# Patient Record
Sex: Female | Born: 1955 | Race: White | Hispanic: No | Smoking: Current every day smoker
Health system: Southern US, Community
[De-identification: ages and names within clinical notes are randomized; demographics above are authoritative.]

## PROBLEM LIST (undated history)

## (undated) DIAGNOSIS — I771 Stricture of artery: Secondary | ICD-10-CM

## (undated) DIAGNOSIS — I779 Disorder of arteries and arterioles, unspecified: Secondary | ICD-10-CM

## (undated) DIAGNOSIS — E559 Vitamin D deficiency, unspecified: Secondary | ICD-10-CM

## (undated) DIAGNOSIS — F419 Anxiety disorder, unspecified: Secondary | ICD-10-CM

## (undated) DIAGNOSIS — F319 Bipolar disorder, unspecified: Secondary | ICD-10-CM

## (undated) DIAGNOSIS — G4730 Sleep apnea, unspecified: Secondary | ICD-10-CM

## (undated) DIAGNOSIS — F32A Depression, unspecified: Secondary | ICD-10-CM

## (undated) DIAGNOSIS — E041 Nontoxic single thyroid nodule: Secondary | ICD-10-CM

## (undated) DIAGNOSIS — I369 Nonrheumatic tricuspid valve disorder, unspecified: Secondary | ICD-10-CM

## (undated) DIAGNOSIS — D499 Neoplasm of unspecified behavior of unspecified site: Secondary | ICD-10-CM

## (undated) DIAGNOSIS — I517 Cardiomegaly: Secondary | ICD-10-CM

## (undated) DIAGNOSIS — D804 Selective deficiency of immunoglobulin M [IgM]: Secondary | ICD-10-CM

## (undated) DIAGNOSIS — I2510 Atherosclerotic heart disease of native coronary artery without angina pectoris: Secondary | ICD-10-CM

## (undated) DIAGNOSIS — J449 Chronic obstructive pulmonary disease, unspecified: Secondary | ICD-10-CM

## (undated) DIAGNOSIS — E042 Nontoxic multinodular goiter: Secondary | ICD-10-CM

## (undated) DIAGNOSIS — I2585 Chronic coronary microvascular dysfunction: Secondary | ICD-10-CM

## (undated) DIAGNOSIS — E669 Obesity, unspecified: Secondary | ICD-10-CM

## (undated) DIAGNOSIS — E785 Hyperlipidemia, unspecified: Secondary | ICD-10-CM

## (undated) DIAGNOSIS — E119 Type 2 diabetes mellitus without complications: Secondary | ICD-10-CM

## (undated) DIAGNOSIS — N6019 Diffuse cystic mastopathy of unspecified breast: Secondary | ICD-10-CM

## (undated) DIAGNOSIS — T7840XA Allergy, unspecified, initial encounter: Secondary | ICD-10-CM

## (undated) DIAGNOSIS — E1142 Type 2 diabetes mellitus with diabetic polyneuropathy: Secondary | ICD-10-CM

## (undated) DIAGNOSIS — Z720 Tobacco use: Secondary | ICD-10-CM

## (undated) DIAGNOSIS — K219 Gastro-esophageal reflux disease without esophagitis: Secondary | ICD-10-CM

## (undated) DIAGNOSIS — I10 Essential (primary) hypertension: Secondary | ICD-10-CM

## (undated) HISTORY — DX: Essential (primary) hypertension: I10

## (undated) HISTORY — DX: Nontoxic multinodular goiter: E04.2

## (undated) HISTORY — DX: Diffuse cystic mastopathy of unspecified breast: N60.19

## (undated) HISTORY — DX: Vitamin D deficiency, unspecified: E55.9

## (undated) HISTORY — DX: Type 2 diabetes mellitus without complications: E11.9

## (undated) HISTORY — DX: Selective deficiency of immunoglobulin m (igm): D80.4

## (undated) HISTORY — DX: Nontoxic single thyroid nodule: E04.1

## (undated) HISTORY — PX: CHOLECYSTECTOMY: SHX55

## (undated) HISTORY — DX: Allergy, unspecified, initial encounter: T78.40XA

## (undated) HISTORY — DX: Hyperlipidemia, unspecified: E78.5

## (undated) HISTORY — DX: Bipolar disorder, unspecified: F31.9

## (undated) HISTORY — PX: BACK SURGERY: SHX140

## (undated) HISTORY — DX: Chronic obstructive pulmonary disease, unspecified: J44.9

## (undated) HISTORY — DX: Neoplasm of unspecified behavior of unspecified site: D49.9

## (undated) HISTORY — DX: Sleep apnea, unspecified: G47.30

## (undated) HISTORY — DX: Cardiomegaly: I51.7

## (undated) HISTORY — DX: Tobacco use: Z72.0

## (undated) HISTORY — PX: TONSILLECTOMY: SUR1361

## (undated) HISTORY — PX: TOTAL ABDOMINAL HYSTERECTOMY: SHX209

## (undated) HISTORY — DX: Depression, unspecified: F32.A

## (undated) HISTORY — DX: Anxiety disorder, unspecified: F41.9

## (undated) HISTORY — DX: Obesity, unspecified: E66.9

---

## 2014-08-26 DIAGNOSIS — E1140 Type 2 diabetes mellitus with diabetic neuropathy, unspecified: Secondary | ICD-10-CM | POA: Insufficient documentation

## 2015-09-08 DIAGNOSIS — E669 Obesity, unspecified: Secondary | ICD-10-CM | POA: Insufficient documentation

## 2015-10-19 DIAGNOSIS — E781 Pure hyperglyceridemia: Secondary | ICD-10-CM | POA: Insufficient documentation

## 2015-10-19 DIAGNOSIS — R12 Heartburn: Secondary | ICD-10-CM | POA: Insufficient documentation

## 2019-02-26 DIAGNOSIS — M5412 Radiculopathy, cervical region: Secondary | ICD-10-CM | POA: Insufficient documentation

## 2019-02-26 DIAGNOSIS — E119 Type 2 diabetes mellitus without complications: Secondary | ICD-10-CM | POA: Insufficient documentation

## 2020-07-14 NOTE — Progress Notes (Signed)
[facility]  [address] [place], [place] [address] Phone: [phone]  Fax: [phone]   Clinic Day:  07/15/2020  Referring physician: Remi Haggard, FNP  Chief Complaint: Rebecca Frost is a 64 y.o. female with a elevated hemoglobin who is referred in consultation by Threasa Alpha, FNP for assessment and management.   HPI: The patient was diagnosed with elevated hemoglobin.  CBC has been followed: 11/06/2019: Hematocrit 41.2, hemoglobin 14.1, MCV 88, platelets 252,000, WBC 10,600 (ANC 6,200).                       02/05/2020: Hematocrit 45.0, hemoglobin 13.7, MCV 95, platelets 273,000, WBC 10,800 (ANC 7,210).                       05/05/2020: Hematocrit 45.0, hemoglobin 15.0, MCV 90, platelets 306,000, WBC 13,600 (ANC 9,200).    Additional labs from 05/05/2020 revealed a PT/INR of 11.5/0.99 and PTT of 31.5. Protein C total antigen (> 95%), protein S total antigen (121%), and anti-thrombin antigen (105%) were normal. 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  Symptomatically, she feels okay. She has been weak since the summer. She reports blurry vision, cough in the mornings, runny nose, sore throat, shortness of breath, chronic diarrhea, reflux, hip pain, dry skin, and neuropathy in her feet. She has osteoarthritis in her feet.  She denies fevers, sweats, headaches, shortness of breath, chest pain, palpitations, nausea, vomiting, nose bleeds, gum bleeds, urinary symptoms, and balance or coordination problems.  She notes easy bleeding.  She has bled easily for her whole life. If she gets cut or has chapped lips, she will bleed for hours. She states that she has a tumor or a cyst in the back of her neck. Her doctor wants to do some work on it but is hesitant due to her bleeding problem. The patient requests a Von  Willebrand panel.  The patient does not take aspirin or ibuprofen. She uses Tylenol when she has pain. She does not take any hormones.  She has sleep apnea and uses a CPAP machine. She does not have any cardiac issues to her knowledge. She denies any surgeries on her stomach.  She denies a history of blood clots and menorrhagia. She had a total hysterectomy in 1989 due to tumors in her uterus. She had a tooth pulled, a tonsillectomy, lower back surgery in 2006, and cholecystectomy. With each of these, she had a lot of bleeding. Her mom told her that she had a blood transfusion when she was young.  Her maternal grandmother had multiple myeloma, was on dialysis, and had "thick blood".  Her brother had stomach cancer 8 years ago.  The patient smokes a pack a day.  She is interested in the low-dose chest CT program.   Past Medical History:  Diagnosis Date  . Allergy   . Anxiety   . Bipolar 1 disorder ([place])   . Cardiomegaly   . COPD (chronic obstructive pulmonary disease) ([place])   . Depression   . Diabetes mellitus without complication ([place])   . Fibrocystic breast   . Goiter, nontoxic, multinodular   . Hyperlipidemia   . Hypertension   . 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 Obesity   . Sleep apnea   . Thyroid nodule   . Tobacco abuse   . 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 Vitamin D deficiency     Past Surgical History:  Procedure Laterality Date  . BACK SURGERY    . CHOLECYSTECTOMY    . TONSILLECTOMY     age 69  . TOTAL ABDOMINAL HYSTERECTOMY      Family History  Problem Relation Age of Onset  . Hypertension Mother   . Diabetes Mother   . Heart murmur Mother   . Hypertension Father   . Cancer Brother   . Cancer Maternal Grandmother   . Stroke Half-Brother   . Diabetes Half-Brother   . Cancer Half-Sister     Social History:  reports that she has been smoking cigarettes. She has a 30.00 pack-year smoking history. She has never used smokeless tobacco. She  reports previous alcohol use. She reports that she does not use drugs. She drinks on holidays. She has smoked on and off since she was 36.  She smokes a pack a day.  She denies any exposure to radiation or toxins. She lived in [place], [place] for 10 years, and [place] for 5 years. She lives in [place]. The patient is alone today.  Allergies:  Allergies  Allergen Reactions  . Aspirin   . Azithromycin   . Celebrex [Celecoxib]   . Codeine   . Drug Class [Sulfa Antibiotics]     Patient states all antibiotics  . Iodine   . Lortab [Hydrocodone-Acetaminophen]   . Niacin   . Nsaids   . Statins     Current Medications: Current Outpatient Medications  Medication Sig Dispense Refill  . CRANBERRY PO Take by mouth.    . DULoxetine (CYMBALTA) 60 MG capsule duloxetine 60 mg capsule,delayed release    . empagliflozin (JARDIANCE) 25 MG TABS tablet Jardiance 25 mg tablet    . gabapentin (NEURONTIN) 300 MG capsule Take 300 mg by mouth 3 (three) times daily. Patient reports she is taking 2-4 pills a day    . gemfibrozil (LOPID) 600 MG tablet gemfibrozil 600 mg tablet    . lidocaine (XYLOCAINE) 5 % ointment Apply topically.    Marland Kitchen lisinopril (ZESTRIL) 10 MG tablet lisinopril 10 mg tablet    . lurasidone (LATUDA) 40 MG TABS tablet Latuda 40 mg tablet    . metFORMIN (GLUCOPHAGE) 500 MG tablet Take 1,000 mg by mouth 2 (two) times daily.    . Omega-3 Fatty Acids (FISH OIL) 1000 MG CAPS omega-3 acid ethyl esters 1 gram capsule    . QUEtiapine (SEROQUEL) 100 MG tablet Take 100 mg by mouth at bedtime.    . Turmeric (QC TUMERIC COMPLEX PO) Take by mouth.    Marland Kitchen VITAMIN D PO Take by mouth.    . vitamin k 100 MCG tablet Take 100 mcg by mouth daily.     No current facility-administered medications for this visit.    Review of Systems  Constitutional: Negative for chills, diaphoresis, fever, malaise/fatigue and weight loss.       Feels "weak" since the summer.  HENT: Positive for sore throat.  Negative for congestion, ear discharge, ear pain, hearing loss, nosebleeds, sinus pain and tinnitus.        Runny nose.  Eyes: Positive for blurred vision.  Respiratory: Positive for cough (in the mornings) and shortness of breath. Negative for hemoptysis and sputum production.   Cardiovascular: Negative for chest pain, palpitations and leg swelling.  Gastrointestinal: Positive for diarrhea (chronic) and heartburn (reflux). Negative for abdominal pain, blood in stool, constipation,  melena, nausea and vomiting.  Genitourinary: Negative for dysuria, frequency, hematuria and urgency.  Musculoskeletal: Positive for joint pain (hips). Negative for back pain, myalgias and neck pain.       Osteoarthritis  Skin: Negative for itching and rash.       Dry skin  Neurological: Positive for sensory change (neuropathy in feet) and weakness (since the summer). Negative for dizziness, tingling and headaches.  Endo/Heme/Allergies: Bruises/bleeds easily (bleeding).  Psychiatric/Behavioral: Negative for depression and memory loss. The patient is not nervous/anxious and does not have insomnia.   All other systems reviewed and are negative.   Performance status (ECOG): 1  Vitals Blood pressure 121/71, pulse 83, temperature (!) 97.1 F (36.2 C), temperature source Tympanic, resp. rate 18, weight 218 lb 4.1 oz (99 kg), SpO2 96 %.   Physical Exam Vitals and nursing note reviewed.  Constitutional:      General: She is not in acute distress.    Appearance: She is not diaphoretic.  HENT:     Head: Normocephalic and atraumatic.     Comments: Gray hair.    Mouth/Throat:     Mouth: Mucous membranes are moist.     Pharynx: Oropharynx is clear.  Eyes:     General: No scleral icterus.    Extraocular Movements: Extraocular movements intact.     Conjunctiva/sclera: Conjunctivae normal.     Pupils: Pupils are equal, round, and reactive to light.     Comments: Hazel eyes.  Cardiovascular:     Rate and Rhythm:  Normal rate and regular rhythm.     Heart sounds: Normal heart sounds. No murmur heard.   Pulmonary:     Effort: Pulmonary effort is normal. No respiratory distress.     Breath sounds: Normal breath sounds. No wheezing or rales.  Chest:     Chest wall: No tenderness.  Abdominal:     General: Bowel sounds are normal. There is no distension.     Palpations: Abdomen is soft. There is no hepatomegaly, splenomegaly or mass.     Tenderness: There is no abdominal tenderness. There is no guarding or rebound.  Musculoskeletal:        General: No swelling or tenderness. Normal range of motion.     Cervical back: Normal range of motion and neck supple.  Lymphadenopathy:     Head:     Right side of head: No preauricular, posterior auricular or occipital adenopathy.     Left side of head: No preauricular, posterior auricular or occipital adenopathy.     Cervical: No cervical adenopathy.     Upper Body:     Right upper body: No supraclavicular or axillary adenopathy.     Left upper body: No supraclavicular or axillary adenopathy.     Lower Body: No right inguinal adenopathy. No left inguinal adenopathy.  Skin:    General: Skin is warm and dry.  Neurological:     Mental Status: She is alert and oriented to person, place, and time.  Psychiatric:        Behavior: Behavior normal.        Thought Content: Thought content normal.        Judgment: Judgment normal.    Clinical Support on 07/15/2020  Component Date Value Ref Range Status  . Prothrombin Time 07/15/2020 12.2  11.4 - 15.2 seconds Final  . INR 07/15/2020 0.9  0.8 - 1.2 Final   Comment: (NOTE) INR goal varies based on device and disease states. Performed at [facility], [phone]  [address] [place], [place] [address]   . Carbon Monoxide, Blood 07/15/2020 7.9* 0.0 - 3.6 % Final   Comment: (NOTE)                            Environmental Exposure:                             Nonsmokers           <3.7                              Smokers              <9.9                            Occupational Exposure:                             BEI                   3.5                                Detection Limit =  0.2 Performed At: [facility] [place], [place] [id number] Rush Farmer MD [id number]   . PFA Interpretation 07/15/2020          Final   Comment: Platelet function is normal. If patient history/physical examination give strong indication of a bleeding disorder repeat testing for confirmation.        Results of the test should always be interpreted in conjunction with the patient's medical history, clinical presentation and medication history. Patients with Hematocrit values <35.0% or Platelet counts <150,000/uL may result in values above the Laboratory established reference range.   . Collagen / Epinephrine 07/15/2020 108  0 - 193 seconds Final   Performed at [facility], [address], [place], [place] [address]  . Coagulation Factor VIII 07/15/2020 262* 56 - 140 % Final   Comment: (NOTE) FVIII activity can increase in a variety of clinical situations including normal pregnancy, in samples drawn from patients (particularly children) who are visibly stressed at the time of phlebotomy, as acute phase reactants, or in response to certain drug therapies such as DDAVP.  Persistently elevated FVIII activity is a risk factor for venous thrombosis as well as recurrence of venous thrombosis.  Risk is graded and increases with the degree of elevation.  Although elevated FVIII activity has been identified to cluster within families, a genetic basis for the elevation has not yet been elucidated (Br J Haematol. 2012; 891:694-503).   . Ristocetin Co-factor, Plasma 07/15/2020 181  50 - 200 % Final   Comment: (NOTE) Performed At: [facility] [place], [place] [id number] Rush Farmer MD [id number]   . Von Willebrand Antigen, Plasma 07/15/2020 236* 50 -  200 % Final   Comment: (NOTE) VWF may elevate in normal pregnancy, in samples drawn from patients (particularly children) who are visibly stressed at the time of phlebotomy, as acute phase reactants, or in response to certain drug therapies such as DDAVP.  These and other situations may increase levels compared to baseline values.  For these reasons, it may be necessary  to repeat testing to make a diagnosis of VWD. This test was developed and its performance characteristics determined by [facility]. It has not been cleared or approved by the Food and Drug Administration.   . Sodium 07/15/2020 137  135 - 145 mmol/L Final  . Potassium 07/15/2020 4.0  3.5 - 5.1 mmol/L Final  . Chloride 07/15/2020 100  98 - 111 mmol/L Final  . CO2 07/15/2020 25  22 - 32 mmol/L Final  . Glucose, Bld 07/15/2020 210* 70 - 99 mg/dL Final   Glucose reference range applies only to samples taken after fasting for at least 8 hours.  . BUN 07/15/2020 11  8 - 23 mg/dL Final  . Creatinine, Ser 07/15/2020 0.67  0.44 - 1.00 mg/dL Final  . Calcium 07/15/2020 9.8  8.9 - 10.3 mg/dL Final  . Total Protein 07/15/2020 7.8  6.5 - 8.1 g/dL Final  . Albumin 07/15/2020 4.2  3.5 - 5.0 g/dL Final  . AST 07/15/2020 15  15 - 41 U/L Final  . ALT 07/15/2020 15  0 - 44 U/L Final  . Alkaline Phosphatase 07/15/2020 69  38 - 126 U/L Final  . Total Bilirubin 07/15/2020 0.5  0.3 - 1.2 mg/dL Final  . GFR, Estimated 07/15/2020 >60  >60 mL/min Final   Comment: (NOTE) Calculated using the CKD-EPI Creatinine Equation (2021)   . Anion gap 07/15/2020 12  5 - 15 Final   Performed at [facility], [address], [place], [place] [address]  . WBC 07/15/2020 12.7* 4.0 - 10.5 K/uL Final  . RBC 07/15/2020 5.34* 3.87 - 5.11 MIL/uL Final  . Hemoglobin 07/15/2020 15.4* 12.0 - 15.0 g/dL Final  . HCT 07/15/2020 45.4  36 - 46 % Final  . MCV 07/15/2020 85.0  80.0 - 100.0 fL Final  . MCH 07/15/2020 28.8  26.0 - 34.0 pg Final  . MCHC  07/15/2020 33.9  30.0 - 36.0 g/dL Final  . RDW 07/15/2020 13.0  11.5 - 15.5 % Final  . Platelets 07/15/2020 253  150 - 400 K/uL Final  . nRBC 07/15/2020 0.0  0.0 - 0.2 % Final  . Neutrophils Relative % 07/15/2020 66  % Final  . Neutro Abs 07/15/2020 8.5* 1.7 - 7.7 K/uL Final  . Lymphocytes Relative 07/15/2020 25  % Final  . Lymphs Abs 07/15/2020 3.2  0.7 - 4.0 K/uL Final  . Monocytes Relative 07/15/2020 6  % Final  . Monocytes Absolute 07/15/2020 0.7  0.1 - 1.0 K/uL Final  . Eosinophils Relative 07/15/2020 1  % Final  . Eosinophils Absolute 07/15/2020 0.2  0.0 - 0.5 K/uL Final  . Basophils Relative 07/15/2020 1  % Final  . Basophils Absolute 07/15/2020 0.1  0.0 - 0.1 K/uL Final  . Immature Granulocytes 07/15/2020 1  % Final  . Abs Immature Granulocytes 07/15/2020 0.09* 0.00 - 0.07 K/uL Final   Performed at [facility], [address], [place], [place] [address]  . Interpretation 07/15/2020 Note   Final   Comment: (NOTE) ------------------------------- COAGULATION: VON WILLEBRAND FACTOR ASSESSMENT CURRENT RESULTS ASSESSMENT The VWF:Ag is elevated. The VWF:RCo is normal. The FVIII is elevated. VON WILLEBRAND FACTOR ASSESSMENT CURRENT RESULTS INTERPRETATION - These results are not consistent with a diagnosis of VWD according to the current NHLBI guideline. Persistently elevated FVIII activity is a risk factor for venous thrombosis as well as recurrence of venous thrombosis. Risk is graded and increases with the degree of elevation. Although elevated FVIII activity has been identified to cluster  within families, a genetic basis for the elevation has not yet been elucidated (Br J Haematol. 2012; 157(6):653-663). VON WILLEBRAND FACTOR ASSESSMENT - VWF and FVIII may elevate as compared to baseline in samples drawn from patients (particularly children) who are visibly stressed at the time of phlebotomy, as acute phase reactants, or in response to certain drug therapies  such as d                          esmopressin. Repeat testing may be necessary before excluding a diagnosis of VWD especially if the clinical suspicion is high for an underlying bleeding disorder. The setting for phlebotomy should be as calm as possible and patients should be encouraged to sit quietly prior to the blood draw. VON WILLEBRAND FACTOR ASSESSMENT DEFINITIONS - VWD - von Willebrand disease; VWF - von Willebrand factor; VWF:Ag - VWF antigen; VWF:RCo - VWF ristocetin cofactor activity; FVIII - factor VIII activity. MEDICAL DIRECTOR: For questions regarding panel interpretation, please contact Jake Bathe, M.D. at [facility] at [phone]. ------------------------------- DISCLAIMER These assessments and interpretations are provided as a convenience in support of the physician-patient relationship and are not intended to replace the physician's clinical judgment. They are derived from national guidelines in addition to other evidence and expert opinion. The clinician                           should consider this information within the context of clinical opinion and the individual patient. SEE GUIDANCE FOR VON WILLEBRAND FACTOR ASSESSMENT: (1) The National Heart, Lung and Blood Institute. The Diagnosis, Evaluation and Management of von Willebrand Disease. Janeal Holmes, MD: [place] Publication 06-6268. 4854. Available at vSpecials.com.pt. (2) Daryl Eastern et al. [place] J Hematol. 2009; 84(6):366-370. (3) [place]. 2004;10(3):199-217. (4) Pasi KJ et al. Haemophilia. 2004; 10(3):218-231. Performed At: [facility] [place], [place] [id number] Thomasene Ripple MD [id number]     Assessment:  Rebecca Frost is a 64 y.o. female with an elevated hemoglobin.  Hemoglobin has ranged from 13.7 - 15.0 since 10/2019.  She smokes 1 pack/day.  She denies any cardiac issues.   She does not take testosterone.  She has sleep apnea and uses a CPAP machine. She does not have any cardiac issues  Labs on 05/05/2020 revealed a hematocrit 45.0, hemoglobin 15.0, MCV 90, platelets 306,000, WBC 13,600 (ANC 9,200).  PT/INR was 11.5/0.99 and PTT of 31.5. Protein C total antigen (> 95%), protein S total antigen (121%), and anti-thrombin antigen (105%) were normal. 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  She has a bleeding diathesis.  She has bled easily for her whole life. She bleed for hours with minor trauma. She does not take aspirin or ibuprofen. She uses Tylenol when she has pain.  She had a total hysterectomy in 1989 due to tumors in her uterus. She had a tooth pulled, a tonsillectomy, lower back surgery in 2006, and cholecystectomy. With each of these, she had a lot of bleeding. She had a blood transfusion when she was young.  She has a tumor or a cyst in the back of her neck that requires surgery.   Symptomatically, she has felt weak since the summer.  She notes easy bleeding.  She denies any headaches or erythromyalgia.  Exam reveals  no adenopathy or hepatosplenomegaly.  Plan: 1.   Labs today:   CBC with diff, CMP, carbon monoxide level, PT/INR, PTT, von Willebrand's panel, platelet function assay. 2.   Elevated hemoglobin  Hemoglobin has ranged between 13.7-15.0 since 10/2019.  Patient does not have erythrocytosis as her hemoglobin is < 16.  Suspect etiology of elevated hemoglobin is due to smoking vs possible sleep apnea.  Discuss limited work-up.  If carbon monoxide not elevated, will pursue additional work-up (epo level, JAK2 with reflex). 3.   Bleeding diathesis  Patient notes excessive bleeding with multiple surgeries.  She denies aspirin or ibuprofen use.  Discuss initial testing.  Mild von Willebrand's disease may be problematic to  diagnose as protein level fluctuates.   Discuss plan for repeat von Willebrand testing when excess bleeding noted if today's results are in the low normal range. 4.   RTC in 1 week for MD assessment, review of work-up, and discussion regarding direction of therapy.  I discussed the assessment and treatment plan with the patient.  The patient was provided an opportunity to ask questions and all were answered.  The patient agreed with the plan and demonstrated an understanding of the instructions.  The patient was advised to call back if the symptoms worsen or if the condition fails to improve as anticipated.  I provided 27 minutes of face-to-face time during this this encounter and > 50% was spent counseling as documented under my assessment and plan.    Jenet Durio C. Mike Gip, MD, PhD    07/15/2020, 11:31 PM   I, Mirian Mo Tufford, am acting as Education administrator for [employer]. Mike Gip, MD, PhD.  I, Terre Zabriskie C. Mike Gip, MD, have reviewed the above documentation for accuracy and completeness, and I agree with the above.

## 2020-07-15 ENCOUNTER — Other Ambulatory Visit: Payer: Self-pay

## 2020-07-15 ENCOUNTER — Inpatient Hospital Stay: Payer: Medicare Other | Attending: Hematology and Oncology | Admitting: Hematology and Oncology

## 2020-07-15 ENCOUNTER — Ambulatory Visit: Payer: Medicare Other

## 2020-07-15 ENCOUNTER — Encounter: Payer: Self-pay | Admitting: Hematology and Oncology

## 2020-07-15 VITALS — BP 121/71 | HR 83 | Temp 97.1°F | Resp 18 | Wt 218.3 lb

## 2020-07-15 DIAGNOSIS — I10 Essential (primary) hypertension: Secondary | ICD-10-CM | POA: Diagnosis not present

## 2020-07-15 DIAGNOSIS — F319 Bipolar disorder, unspecified: Secondary | ICD-10-CM | POA: Diagnosis not present

## 2020-07-15 DIAGNOSIS — I517 Cardiomegaly: Secondary | ICD-10-CM | POA: Diagnosis not present

## 2020-07-15 DIAGNOSIS — D582 Other hemoglobinopathies: Secondary | ICD-10-CM | POA: Insufficient documentation

## 2020-07-15 DIAGNOSIS — E119 Type 2 diabetes mellitus without complications: Secondary | ICD-10-CM | POA: Diagnosis not present

## 2020-07-15 DIAGNOSIS — R531 Weakness: Secondary | ICD-10-CM | POA: Diagnosis not present

## 2020-07-15 DIAGNOSIS — J449 Chronic obstructive pulmonary disease, unspecified: Secondary | ICD-10-CM | POA: Diagnosis not present

## 2020-07-15 DIAGNOSIS — D699 Hemorrhagic condition, unspecified: Secondary | ICD-10-CM

## 2020-07-15 DIAGNOSIS — E669 Obesity, unspecified: Secondary | ICD-10-CM | POA: Diagnosis not present

## 2020-07-15 DIAGNOSIS — Z800 Family history of malignant neoplasm of digestive organs: Secondary | ICD-10-CM | POA: Insufficient documentation

## 2020-07-15 DIAGNOSIS — Z7984 Long term (current) use of oral hypoglycemic drugs: Secondary | ICD-10-CM | POA: Diagnosis not present

## 2020-07-15 DIAGNOSIS — G4730 Sleep apnea, unspecified: Secondary | ICD-10-CM | POA: Insufficient documentation

## 2020-07-15 DIAGNOSIS — D751 Secondary polycythemia: Secondary | ICD-10-CM | POA: Insufficient documentation

## 2020-07-15 DIAGNOSIS — H538 Other visual disturbances: Secondary | ICD-10-CM | POA: Insufficient documentation

## 2020-07-15 DIAGNOSIS — R718 Other abnormality of red blood cells: Secondary | ICD-10-CM | POA: Insufficient documentation

## 2020-07-15 DIAGNOSIS — Z79899 Other long term (current) drug therapy: Secondary | ICD-10-CM | POA: Insufficient documentation

## 2020-07-15 DIAGNOSIS — F17210 Nicotine dependence, cigarettes, uncomplicated: Secondary | ICD-10-CM | POA: Diagnosis not present

## 2020-07-15 LAB — CBC WITH DIFFERENTIAL/PLATELET
Abs Immature Granulocytes: 0.09 10*3/uL — ABNORMAL HIGH (ref 0.00–0.07)
Basophils Absolute: 0.1 10*3/uL (ref 0.0–0.1)
Basophils Relative: 1 %
Eosinophils Absolute: 0.2 10*3/uL (ref 0.0–0.5)
Eosinophils Relative: 1 %
HCT: 45.4 % (ref 36.0–46.0)
Hemoglobin: 15.4 g/dL — ABNORMAL HIGH (ref 12.0–15.0)
Immature Granulocytes: 1 %
Lymphocytes Relative: 25 %
Lymphs Abs: 3.2 10*3/uL (ref 0.7–4.0)
MCH: 28.8 pg (ref 26.0–34.0)
MCHC: 33.9 g/dL (ref 30.0–36.0)
MCV: 85 fL (ref 80.0–100.0)
Monocytes Absolute: 0.7 10*3/uL (ref 0.1–1.0)
Monocytes Relative: 6 %
Neutro Abs: 8.5 10*3/uL — ABNORMAL HIGH (ref 1.7–7.7)
Neutrophils Relative %: 66 %
Platelets: 253 10*3/uL (ref 150–400)
RBC: 5.34 MIL/uL — ABNORMAL HIGH (ref 3.87–5.11)
RDW: 13 % (ref 11.5–15.5)
WBC: 12.7 10*3/uL — ABNORMAL HIGH (ref 4.0–10.5)
nRBC: 0 % (ref 0.0–0.2)

## 2020-07-15 LAB — COMPREHENSIVE METABOLIC PANEL
ALT: 15 U/L (ref 0–44)
AST: 15 U/L (ref 15–41)
Albumin: 4.2 g/dL (ref 3.5–5.0)
Alkaline Phosphatase: 69 U/L (ref 38–126)
Anion gap: 12 (ref 5–15)
BUN: 11 mg/dL (ref 8–23)
CO2: 25 mmol/L (ref 22–32)
Calcium: 9.8 mg/dL (ref 8.9–10.3)
Chloride: 100 mmol/L (ref 98–111)
Creatinine, Ser: 0.67 mg/dL (ref 0.44–1.00)
GFR, Estimated: >60 mL/min (ref >60)
Glucose, Bld: 210 mg/dL — ABNORMAL HIGH (ref 70–99)
Potassium: 4 mmol/L (ref 3.5–5.1)
Sodium: 137 mmol/L (ref 135–145)
Total Bilirubin: 0.5 mg/dL (ref 0.3–1.2)
Total Protein: 7.8 g/dL (ref 6.5–8.1)

## 2020-07-15 LAB — PROTIME-INR
INR: 0.9 (ref 0.8–1.2)
Prothrombin Time: 12.2 seconds (ref 11.4–15.2)

## 2020-07-15 LAB — PLATELET FUNCTION ASSAY: Collagen / Epinephrine: 108 seconds (ref 0–193)

## 2020-07-15 NOTE — Progress Notes (Signed)
Patient would like to know her blood type

## 2020-07-16 LAB — VON WILLEBRAND PANEL
Coagulation Factor VIII: 262 % — ABNORMAL HIGH (ref 56–140)
Ristocetin Co-factor, Plasma: 181 % (ref 50–200)
Von Willebrand Antigen, Plasma: 236 % — ABNORMAL HIGH (ref 50–200)

## 2020-07-16 LAB — COAG STUDIES INTERP REPORT

## 2020-07-17 ENCOUNTER — Telehealth: Payer: Self-pay

## 2020-07-17 LAB — CARBON MONOXIDE, BLOOD (PERFORMED AT REF LAB): Carbon Monoxide, Blood: 7.9 % — ABNORMAL HIGH (ref 0.0–3.6)

## 2020-07-17 NOTE — Telephone Encounter (Signed)
-----   Message from Lequita Asal, MD sent at 07/17/2020  9:04 AM EDT ----- Regarding: Please call patient with result  ----- Message ----- From: Interface, Lab In [place] Sent: 07/15/2020  11:52 AM EDT To: Lequita Asal, MD

## 2020-07-17 NOTE — Telephone Encounter (Signed)
Spoke with the patient to inform her, Per Dr Mike Gip her Carbon monoxide level was at 7.9. The patient was understanding and agreeable.

## 2020-07-22 NOTE — Progress Notes (Signed)
[facility]  [address], [address] [place],  [address] Phone: [phone]  Fax: [phone]   Clinic Day:  07/23/2020  Referring physician: Remi Haggard, FNP  Chief Complaint: Rebecca Frost is a 64 y.o. female with a elevated hemoglobin and possible bleeding diathesis who is seen for review of work-up and discussion regarding direction of therapy.  HPI: The patient was last seen in the hematology clinic on 07/15/2020 for new patient assessment. Symptomatically, she described feeling weak since the summer. She denied headache or paresthesias.  She smokes.  She has sleep apnea and uses a CPAP.  She noted easy bleeding.  She denied taking aspirin or ibuprofen.  Work-up revealed a hematocrit of 45.4, hemoglobin 15.4, platelets 253,000, WBC 12,700 (ANC 8,500). CMP was normal except for a glucose of 210. INR was 0.9. Carbon monoxide was 7.9% (0-3.6%). Platelet function assay was normal.  Von Willebrand panel was negative ( factor VIII was 262%, ristocetin co-factor 181%, and von Willebrand antigen 236%).  During the interim, she has been "ok". She has sleep apnea and uses a CPAP at night. She does not feel refreshed in the mornings, but this has improved since she started using an oxygenator about a week ago. Her other symptoms are stable.  She has a carbon monoxide monitor in her house. She is not interested in the smoking cessation program at this time. Once she cuts back enough, she plans on taking Wellbutrin, which has helped her quit before.  She would like to go to [facility] for additional testing regarding her bleeding diathesis.   Past Medical History:  Diagnosis Date  . Allergy   . Anxiety   . Bipolar 1 disorder ([place])   . Cardiomegaly   . COPD (chronic obstructive pulmonary disease) ([place])   . Depression   . Diabetes mellitus without complication ([place])   . Fibrocystic breast   . Goiter, nontoxic, multinodular   . Hyperlipidemia   . Hypertension    . 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 Obesity   . Sleep apnea   . Thyroid nodule   . Tobacco abuse   . 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 Vitamin D deficiency     Past Surgical History:  Procedure Laterality Date  . BACK SURGERY    . CHOLECYSTECTOMY    . TONSILLECTOMY     age 27  . TOTAL ABDOMINAL HYSTERECTOMY      Family History  Problem Relation Age of Onset  . Hypertension Mother   . Diabetes Mother   . Heart murmur Mother   . Hypertension Father   . Cancer Brother   . Cancer Maternal Grandmother   . Stroke Half-Brother   . Diabetes Half-Brother   . Cancer Half-Sister     Social History:  reports that she has been smoking cigarettes. She has a 30.00 pack-year smoking history. She has never used smokeless tobacco. She reports previous alcohol use. She reports that she does not use drugs. She drinks on holidays. She has smoked on and off since she was 12. She has cut back from about a pack per day since her last visit. The most she ever smoked for 1 pack per day. She denies any exposure to radiation or toxins. She lived in [place], [place] for 10 years, and [place] for 5 years. She lives in [place]. The patient is accompanied by her friend [name].  Allergies:  Allergies  Allergen Reactions  .  Aspirin   . Azithromycin   . Celebrex [Celecoxib]   . Codeine   . Drug Class [Sulfa Antibiotics]     Patient states all antibiotics  . Iodine   . Lortab [Hydrocodone-Acetaminophen]   . Niacin   . Nsaids   . Statins     Current Medications: Current Outpatient Medications  Medication Sig Dispense Refill  . CRANBERRY PO Take by mouth.    . DULoxetine (CYMBALTA) 60 MG capsule duloxetine 60 mg capsule,delayed release    . empagliflozin (JARDIANCE) 25 MG TABS tablet Jardiance 25 mg tablet    . gabapentin (NEURONTIN) 300 MG capsule Take 300 mg by mouth 3 (three) times daily. Patient reports she is taking 2-4 pills a day    . gemfibrozil (LOPID) 600 MG  tablet gemfibrozil 600 mg tablet    . lidocaine (XYLOCAINE) 5 % ointment Apply topically.    Marland Kitchen lisinopril (ZESTRIL) 10 MG tablet lisinopril 10 mg tablet    . lurasidone (LATUDA) 40 MG TABS tablet Latuda 40 mg tablet    . metFORMIN (GLUCOPHAGE) 500 MG tablet Take 1,000 mg by mouth 2 (two) times daily.    . Omega-3 Fatty Acids (FISH OIL) 1000 MG CAPS omega-3 acid ethyl esters 1 gram capsule    . QUEtiapine (SEROQUEL) 100 MG tablet Take 100 mg by mouth at bedtime.    . Turmeric (QC TUMERIC COMPLEX PO) Take by mouth.    Marland Kitchen VITAMIN D PO Take by mouth.    . vitamin k 100 MCG tablet Take 100 mcg by mouth daily.     No current facility-administered medications for this visit.    Review of Systems  Constitutional: Positive for weight loss (2 lbs). Negative for chills, diaphoresis, fever and malaise/fatigue.  HENT: Positive for sore throat. Negative for congestion, ear discharge, ear pain, hearing loss, nosebleeds, sinus pain and tinnitus.        Runny nose.  Eyes: Positive for blurred vision.  Respiratory: Positive for cough (in the mornings) and shortness of breath. Negative for hemoptysis and sputum production.        Sleep apnea, uses CPAP and oxygenator  Cardiovascular: Negative for chest pain, palpitations and leg swelling.  Gastrointestinal: Positive for diarrhea (chronic) and heartburn (reflux). Negative for abdominal pain, blood in stool, constipation, melena, nausea and vomiting.  Genitourinary: Negative for dysuria, frequency, hematuria and urgency.  Musculoskeletal: Positive for joint pain (hips). Negative for back pain, myalgias and neck pain.       Osteoarthritis  Skin: Negative for itching and rash.       Dry skin  Neurological: Positive for sensory change (neuropathy in feet) and weakness (since the summer). Negative for dizziness, tingling and headaches.  Endo/Heme/Allergies: Bruises/bleeds easily (bleeding).  Psychiatric/Behavioral: Negative for depression and memory loss. The  patient is not nervous/anxious and does not have insomnia.   All other systems reviewed and are negative.  Performance status (ECOG): 1  Vitals Blood pressure 113/61, pulse 98, temperature 97.6 F (36.4 C), temperature source Tympanic, resp. rate 16, weight 216 lb 14.9 oz (98.4 kg), SpO2 97 %.   Physical Exam Vitals and nursing note reviewed.  Constitutional:      General: She is not in acute distress.    Appearance: She is not diaphoretic.  HENT:     Head: Normocephalic and atraumatic.     Comments: Gray hair. Eyes:     General: No scleral icterus.    Conjunctiva/sclera: Conjunctivae normal.     Comments: Blue eyes.  Neurological:  Mental Status: She is alert and oriented to person, place, and time. Mental status is at baseline.  Psychiatric:        Behavior: Behavior normal.        Thought Content: Thought content normal.        Judgment: Judgment normal.    No visits with results within 3 Day(s) from this visit.  Latest known visit with results is:  Clinical Support on 07/15/2020  Component Date Value Ref Range Status  . Prothrombin Time 07/15/2020 12.2  11.4 - 15.2 seconds Final  . INR 07/15/2020 0.9  0.8 - 1.2 Final   Comment: (NOTE) INR goal varies based on device and disease states. Performed at [facility], [address] [place], [place] [address]   . Carbon Monoxide, Blood 07/15/2020 7.9* 0.0 - 3.6 % Final   Comment: (NOTE)                            Environmental Exposure:                             Nonsmokers           <3.7                             Smokers              <9.9                            Occupational Exposure:                             BEI                   3.5                                Detection Limit =  0.2 Performed At: [facility] [place], [place] [id number] Rush Farmer MD [id number]   . PFA Interpretation 07/15/2020          Final   Comment: Platelet function is normal. If  patient history/physical examination give strong indication of a bleeding disorder repeat testing for confirmation.        Results of the test should always be interpreted in conjunction with the patient's medical history, clinical presentation and medication history. Patients with Hematocrit values <35.0% or Platelet counts <150,000/uL may result in values above the Laboratory established reference range.   . Collagen / Epinephrine 07/15/2020 108  0 - 193 seconds Final   Performed at [facility], [address] [place],  [address]  . Coagulation Factor VIII 07/15/2020 262* 56 - 140 % Final   Comment: (NOTE) FVIII activity can increase in a variety of clinical situations including normal pregnancy, in samples drawn from patients (particularly children) who are visibly stressed at the time of phlebotomy, as acute phase reactants, or in response to certain drug therapies such as DDAVP.  Persistently elevated FVIII activity is a risk factor for venous thrombosis as well as recurrence of venous thrombosis.  Risk is graded and increases with the degree of elevation.  Although elevated FVIII activity has been identified to cluster within families,  a genetic basis for the elevation has not yet been elucidated (Br J Haematol. 2012; 761:607-371).   . Ristocetin Co-factor, Plasma 07/15/2020 181  50 - 200 % Final   Comment: (NOTE) Performed At: [facility] [place], [place] [id number] Rush Farmer MD OE:[id number]   . Von Willebrand Antigen, Plasma 07/15/2020 236* 50 - 200 % Final   Comment: (NOTE) VWF may elevate in normal pregnancy, in samples drawn from patients (particularly children) who are visibly stressed at the time of phlebotomy, as acute phase reactants, or in response to certain drug therapies such as DDAVP.  These and other situations may increase levels compared to baseline values.  For these reasons, it may be necessary to repeat testing  to make a diagnosis of VWD. This test was developed and its performance characteristics determined by [facility]. It has not been cleared or approved by the Food and Drug Administration.   . Sodium 07/15/2020 137  135 - 145 mmol/L Final  . Potassium 07/15/2020 4.0  3.5 - 5.1 mmol/L Final  . Chloride 07/15/2020 100  98 - 111 mmol/L Final  . CO2 07/15/2020 25  22 - 32 mmol/L Final  . Glucose, Bld 07/15/2020 210* 70 - 99 mg/dL Final   Glucose reference range applies only to samples taken after fasting for at least 8 hours.  . BUN 07/15/2020 11  8 - 23 mg/dL Final  . Creatinine, Ser 07/15/2020 0.67  0.44 - 1.00 mg/dL Final  . Calcium 07/15/2020 9.8  8.9 - 10.3 mg/dL Final  . Total Protein 07/15/2020 7.8  6.5 - 8.1 g/dL Final  . Albumin 07/15/2020 4.2  3.5 - 5.0 g/dL Final  . AST 07/15/2020 15  15 - 41 U/L Final  . ALT 07/15/2020 15  0 - 44 U/L Final  . Alkaline Phosphatase 07/15/2020 69  38 - 126 U/L Final  . Total Bilirubin 07/15/2020 0.5  0.3 - 1.2 mg/dL Final  . GFR, Estimated 07/15/2020 >60  >60 mL/min Final   Comment: (NOTE) Calculated using the CKD-EPI Creatinine Equation (2021)   . Anion gap 07/15/2020 12  5 - 15 Final   Performed at [facility], [address] [place], [place] [address]  . WBC 07/15/2020 12.7* 4.0 - 10.5 K/uL Final  . RBC 07/15/2020 5.34* 3.87 - 5.11 MIL/uL Final  . Hemoglobin 07/15/2020 15.4* 12.0 - 15.0 g/dL Final  . HCT 07/15/2020 45.4  36 - 46 % Final  . MCV 07/15/2020 85.0  80.0 - 100.0 fL Final  . MCH 07/15/2020 28.8  26.0 - 34.0 pg Final  . MCHC 07/15/2020 33.9  30.0 - 36.0 g/dL Final  . RDW 07/15/2020 13.0  11.5 - 15.5 % Final  . Platelets 07/15/2020 253  150 - 400 K/uL Final  . nRBC 07/15/2020 0.0  0.0 - 0.2 % Final  . Neutrophils Relative % 07/15/2020 66  % Final  . Neutro Abs 07/15/2020 8.5* 1.7 - 7.7 K/uL Final  . Lymphocytes Relative 07/15/2020 25  % Final  . Lymphs Abs 07/15/2020 3.2  0.7 - 4.0 K/uL Final  . Monocytes Relative  07/15/2020 6  % Final  . Monocytes Absolute 07/15/2020 0.7  0.1 - 1.0 K/uL Final  . Eosinophils Relative 07/15/2020 1  % Final  . Eosinophils Absolute 07/15/2020 0.2  0.0 - 0.5 K/uL Final  . Basophils Relative 07/15/2020 1  % Final  . Basophils Absolute 07/15/2020 0.1  0.0 - 0.1 K/uL Final  . Immature Granulocytes 07/15/2020 1  %  Final  . Abs Immature Granulocytes 07/15/2020 0.09* 0.00 - 0.07 K/uL Final   Performed at [facility], [address], [place], [place] [address]  . Interpretation 07/15/2020 Note   Final   Comment: (NOTE) ------------------------------- COAGULATION: VON WILLEBRAND FACTOR ASSESSMENT CURRENT RESULTS ASSESSMENT The VWF:Ag is elevated. The VWF:RCo is normal. The FVIII is elevated. VON WILLEBRAND FACTOR ASSESSMENT CURRENT RESULTS INTERPRETATION - These results are not consistent with a diagnosis of VWD according to the current NHLBI guideline. Persistently elevated FVIII activity is a risk factor for venous thrombosis as well as recurrence of venous thrombosis. Risk is graded and increases with the degree of elevation. Although elevated FVIII activity has been identified to cluster within families, a genetic basis for the elevation has not yet been elucidated (Br J Haematol. 2012; 157(6):653-663). VON WILLEBRAND FACTOR ASSESSMENT - VWF and FVIII may elevate as compared to baseline in samples drawn from patients (particularly children) who are visibly stressed at the time of phlebotomy, as acute phase reactants, or in response to certain drug therapies such as d                          esmopressin. Repeat testing may be necessary before excluding a diagnosis of VWD especially if the clinical suspicion is high for an underlying bleeding disorder. The setting for phlebotomy should be as calm as possible and patients should be encouraged to sit quietly prior to the blood draw. VON WILLEBRAND FACTOR ASSESSMENT DEFINITIONS - VWD - von  Willebrand disease; VWF - von Willebrand factor; VWF:Ag - VWF antigen; VWF:RCo - VWF ristocetin cofactor activity; FVIII - factor VIII activity. MEDICAL DIRECTOR: For questions regarding panel interpretation, please contact Jake Bathe, M.D. at [facility] at [phone]. ------------------------------- DISCLAIMER These assessments and interpretations are provided as a convenience in support of the physician-patient relationship and are not intended to replace the physician's clinical judgment. They are derived from national guidelines in addition to other evidence and expert opinion. The clinician                           should consider this information within the context of clinical opinion and the individual patient. SEE GUIDANCE FOR VON WILLEBRAND FACTOR ASSESSMENT: (1) The National Heart, Lung and Blood Institute. The Diagnosis, Evaluation and Management of von Willebrand Disease. Janeal Holmes, MD: [place] Publication 50-9326. 7124. Available at vSpecials.com.pt. (2) Daryl Eastern et al. [place] J Hematol. 2009; 84(6):366-370. (3) [place]. 2004;10(3):199-217. (4) Pasi KJ et al. Haemophilia. 2004; 10(3):218-231. Performed At: [facility] [place], [place] [id number] Thomasene Ripple MD SN:[id number]     Assessment:  Rebecca Frost is a 64 y.o. female with an elevated hemoglobin.  Hemoglobin has ranged from 13.7 - 15.0 since 10/2019.  She smokes 1 pack/day.  She denies any cardiac issues.  She does not take testosterone.  She has sleep apnea and uses a CPAP machine. She does not have any cardiac issues  Labs on 05/05/2020 revealed a hematocrit 45.0, hemoglobin 15.0, MCV 90, platelets 306,000, WBC 13,600 (ANC 9,200).  PT/INR was 11.5/0.99 and PTT of 31.5. Protein C total antigen (> 95%), protein S total antigen (121%), and anti-thrombin antigen (105%) were normal. 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 Factor II gene mutation, Factor V Leiden, lupus anticoagulant panel, and anti-cardiolipin antibodies were negative.  Work-up on  07/15/2020 revealed a hematocrit of 45.4, hemoglobin 15.4, platelets 253,000, WBC 12,700 (ANC 8,500). CMP was normal except for a glucose of 210. INR was 0.9. Carbon monoxide was 7.9% (0-3.6%). Platelet function assay was normal.  Von Willebrand panel was negative ( factor VIII was 262%, ristocetin co-factor 181%, and von Willebrand antigen 236%).  She has a bleeding diathesis.  She has bled easily for her whole life. She bleed for hours with minor trauma. She does not take aspirin or ibuprofen. She describes excess bleeding with a total hysterectomy, dental extraction, tonsillectomy, lower back surgery, and cholecystectomy. She had a blood transfusion when she was young.  She has a tumor or a cyst in the back of her neck that requires surgery.   Symptomatically, she feels "ok". She has sleep apnea and uses a CPAP at night.  She has felt more refreshed in the morning since she started using an oxygenator about a week ago.   Plan: 1.   Labs today:  PTT. 2.   Elevated hemoglobin             Hemoglobin has ranged between 13.7-15.0 since 10/2019.             Hemoglobin was 15.4 on 07/15/2020.    Work-up reviewed.  Carbon monoxide level is elevated secondary to smoking.  She does not have classic erythrocytosis as her hemoglobin is < 16.             Etiology of her slightly elevated hemoglobin is likely due to smoking and/or sleep apnea.             Continue to monitor.  Encourage smoking cessation. 3.   Bleeding diathesis             She notes excessive bleeding with multiple surgeries.             She denies aspirin or ibuprofen use.             Testing revealed a normal PT, PFA, and von Willebrand panel.  Check PTT today as inadvertently not drawn at last visit.  Discuss additional testing which would need to be performed at [facility]  including:   Platelet aggregation studies, electron microscopy, and alpha-2 antiplasmin activity.  Discuss referral to [facility] benign hematology. 4.   Smoking history  Patient has a 30 pack year smoking history.  Discuss referral to the low-dose chest CT screening program. 5.   RN: Call patient with lab results today. 6.   Referral to benign hematology at [facility]. 7.   RTC in 3 months for MD assessment and labs (CBC with diff).  Addendum:  PTT was 31 (normal).  I discussed the assessment and treatment plan with the patient.  The patient was provided an opportunity to ask questions and all were answered.  The patient agreed with the plan and demonstrated an understanding of the instructions.  The patient was advised to call back if the symptoms worsen or if the condition fails to improve as anticipated.  I provided 15 minutes of face-to-face time during this this encounter and > 50% was spent counseling as documented under my assessment and plan.  An additional 6 minutes were spent reviewing her chart (Epic and Care Everywhere) including notes, labs, and imaging studies.    Mick Tanguma C. Mike Gip, MD, PhD    07/23/2020, 12:01 PM  I, Mirian Mo Tufford, am acting as Education administrator for [employer]. Mike Gip, MD, PhD.  I, Kristinia Leavy C. Mike Gip, MD, have reviewed the above  documentation for accuracy and completeness, and I agree with the above.

## 2020-07-23 ENCOUNTER — Other Ambulatory Visit: Payer: Self-pay

## 2020-07-23 ENCOUNTER — Encounter: Payer: Self-pay | Admitting: Hematology and Oncology

## 2020-07-23 ENCOUNTER — Inpatient Hospital Stay: Payer: Medicare Other | Attending: Hematology and Oncology | Admitting: Hematology and Oncology

## 2020-07-23 VITALS — BP 113/61 | HR 98 | Temp 97.6°F | Resp 16 | Wt 216.9 lb

## 2020-07-23 DIAGNOSIS — F319 Bipolar disorder, unspecified: Secondary | ICD-10-CM | POA: Insufficient documentation

## 2020-07-23 DIAGNOSIS — R718 Other abnormality of red blood cells: Secondary | ICD-10-CM | POA: Diagnosis present

## 2020-07-23 DIAGNOSIS — Z7984 Long term (current) use of oral hypoglycemic drugs: Secondary | ICD-10-CM | POA: Insufficient documentation

## 2020-07-23 DIAGNOSIS — F17210 Nicotine dependence, cigarettes, uncomplicated: Secondary | ICD-10-CM | POA: Diagnosis not present

## 2020-07-23 DIAGNOSIS — E669 Obesity, unspecified: Secondary | ICD-10-CM | POA: Insufficient documentation

## 2020-07-23 DIAGNOSIS — J449 Chronic obstructive pulmonary disease, unspecified: Secondary | ICD-10-CM | POA: Diagnosis not present

## 2020-07-23 DIAGNOSIS — R0602 Shortness of breath: Secondary | ICD-10-CM | POA: Insufficient documentation

## 2020-07-23 DIAGNOSIS — Z8249 Family history of ischemic heart disease and other diseases of the circulatory system: Secondary | ICD-10-CM | POA: Insufficient documentation

## 2020-07-23 DIAGNOSIS — I10 Essential (primary) hypertension: Secondary | ICD-10-CM | POA: Diagnosis not present

## 2020-07-23 DIAGNOSIS — G4730 Sleep apnea, unspecified: Secondary | ICD-10-CM | POA: Diagnosis not present

## 2020-07-23 DIAGNOSIS — R531 Weakness: Secondary | ICD-10-CM | POA: Diagnosis not present

## 2020-07-23 DIAGNOSIS — D699 Hemorrhagic condition, unspecified: Secondary | ICD-10-CM

## 2020-07-23 DIAGNOSIS — E119 Type 2 diabetes mellitus without complications: Secondary | ICD-10-CM | POA: Diagnosis not present

## 2020-07-23 DIAGNOSIS — D582 Other hemoglobinopathies: Secondary | ICD-10-CM | POA: Diagnosis not present

## 2020-07-23 DIAGNOSIS — Z79899 Other long term (current) drug therapy: Secondary | ICD-10-CM | POA: Insufficient documentation

## 2020-07-23 DIAGNOSIS — Z9049 Acquired absence of other specified parts of digestive tract: Secondary | ICD-10-CM | POA: Diagnosis not present

## 2020-07-23 LAB — APTT: aPTT: 31 seconds (ref 24–36)

## 2020-08-07 LAB — VON WILLEBRAND FACTOR MULTIMER

## 2020-10-21 NOTE — Progress Notes (Signed)
[facility]  [address], [address] [place], [place] [address] Phone: [phone]  Fax: [phone]   Clinic Day:  10/22/2020  Referring physician: Remi Haggard, FNP  Chief Complaint: Rebecca Frost is a 65 y.o. female with a elevated hemoglobin and possible bleeding diathesis who is seen for 3 month assessment.  HPI: The patient was last seen in the hematology clinic on 07/23/2020. At that time, she felt "ok". She had sleep apnea and used a CPAP at night.  She had felt more refreshed in the morning since she started using an oxygenator about a week prior.   Hemoglobin was 15.4 on 07/15/2020.  Carbon monoxide level was elevated and felt secondary to smoking.  She noted a history of excess bleeding with multiple surgeries.  Normal labs included platelet count, PT, PTT, PFA, and von Willebrand panel. She was referred to [facility] benign hematology.  During the interim, she has felt good. She bruises easily on her arms but thinks that it is due to her age. She is taking vitamin K to help.   The patient has sleep apnea. She is on a CPAP and an oxygenator. Her oxygenator was broken for 2-3 months and she recently got it repaired. Her shortness of breath and cough in the mornings have improved. She is smoking about 3/4 pack per day and is trying to cut back. She declines a referral to the smoking cessation clinic.  The patient went to the eye doctor today and got a new glasses prescription. 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 Her heartburn, neuropathy, and runny nose are stable. The runny nose is due to allergies. The patient denies nausea, vomiting, and diarrhea.  She has not seen [facility] benign hematology.   Past Medical History:  Diagnosis Date  . Allergy   . Anxiety   . Bipolar 1 disorder ([place])   . Cardiomegaly   . COPD (chronic obstructive pulmonary disease) ([place])   . Depression   . Diabetes mellitus without complication ([place])   . Fibrocystic breast   .  Goiter, nontoxic, multinodular   . Hyperlipidemia   . Hypertension   . 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 Obesity   . Sleep apnea   . Thyroid nodule   . Tobacco abuse   . 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 Vitamin D deficiency     Past Surgical History:  Procedure Laterality Date  . BACK SURGERY    . CHOLECYSTECTOMY    . TONSILLECTOMY     age 82  . TOTAL ABDOMINAL HYSTERECTOMY      Family History  Problem Relation Age of Onset  . Hypertension Mother   . Diabetes Mother   . Heart murmur Mother   . Hypertension Father   . Cancer Brother   . Cancer Maternal Grandmother   . Stroke Half-Brother   . Diabetes Half-Brother   . Cancer Half-Sister     Social History:  reports that she has been smoking cigarettes. She has a 30.00 pack-year smoking history. She has never used smokeless tobacco. She reports previous alcohol use. She reports that she does not use drugs. She drinks on holidays. She has smoked on and off since she was 59. She has cut back from about a pack per day since her last visit. The most she ever smoked for 1 pack per day. She denies any exposure to radiation or toxins. She lived in [place], [place]  for 10 years, and [place] for 5 years. She lives in [place]. The patient is alone [name].  Allergies:  Allergies  Allergen Reactions  . Aspirin   . Azithromycin   . Celebrex [Celecoxib]   . Codeine   . Drug Class [Sulfa Antibiotics]     Patient states all antibiotics  . Iodine   . Levofloxacin Swelling  . Lortab [Hydrocodone-Acetaminophen]   . Niacin   . Nsaids   . Statins     Current Medications: Current Outpatient Medications  Medication Sig Dispense Refill  . CRANBERRY PO Take by mouth.    . DULoxetine (CYMBALTA) 60 MG capsule duloxetine 60 mg capsule,delayed release    . empagliflozin (JARDIANCE) 25 MG TABS tablet Jardiance 25 mg tablet    . gabapentin (NEURONTIN) 300 MG capsule Take 300 mg by mouth 3 (three) times daily.  Patient reports she is taking 2-4 pills a day    . lidocaine (XYLOCAINE) 5 % ointment Apply topically.    Marland Kitchen lisinopril (ZESTRIL) 10 MG tablet lisinopril 10 mg tablet    . lurasidone (LATUDA) 40 MG TABS tablet Latuda 40 mg tablet    . metFORMIN (GLUCOPHAGE) 500 MG tablet Take 1,000 mg by mouth 2 (two) times daily.    . Omega-3 Fatty Acids (FISH OIL) 1000 MG CAPS omega-3 acid ethyl esters 1 gram capsule    . QUEtiapine (SEROQUEL) 100 MG tablet Take 100 mg by mouth at bedtime.    . Turmeric (QC TUMERIC COMPLEX PO) Take by mouth.    Marland Kitchen VITAMIN D PO Take by mouth.    . vitamin k 100 MCG tablet Take 100 mcg by mouth daily.    Marland Kitchen gemfibrozil (LOPID) 600 MG tablet gemfibrozil 600 mg tablet (Patient not taking: Reported on 10/22/2020)     No current facility-administered medications for this visit.    Review of Systems  Constitutional: Positive for weight loss (2 lbs). Negative for chills, diaphoresis, fever and malaise/fatigue.       Feels "good".  HENT: Negative.  Negative for congestion, ear discharge, ear pain, hearing loss, nosebleeds, sinus pain, sore throat and tinnitus.   Eyes: Positive for blurred vision.  Respiratory: Positive for cough (in the mornings, improved) and shortness of breath (improved). Negative for hemoptysis and sputum production.        Sleep apnea, uses CPAP and oxygenator  Cardiovascular: Negative.  Negative for chest pain, palpitations and leg swelling.  Gastrointestinal: Positive for heartburn (reflux). Negative for abdominal pain, blood in stool, constipation, diarrhea, melena, nausea and vomiting.  Genitourinary: Negative for dysuria, frequency, hematuria and urgency.  Musculoskeletal: Positive for joint pain (hips, improved). Negative for back pain, myalgias and neck pain.       Osteoarthritis  Skin: Negative.  Negative for itching and rash.  Neurological: Positive for sensory change (neuropathy and tenderness in feet). Negative for dizziness, tingling, weakness and  headaches.  Endo/Heme/Allergies: Positive for environmental allergies (runny nose). Bruises/bleeds easily (bruising).  Psychiatric/Behavioral: Negative.  Negative for depression and memory loss. The patient is not nervous/anxious and does not have insomnia.   All other systems reviewed and are negative.  Performance status (ECOG): 1  Vitals Blood pressure 125/65, pulse 85, temperature 98 F (36.7 C), temperature source Tympanic, resp. rate 16, weight 214 lb 4.6 oz (97.2 kg), SpO2 96 %.   Physical Exam Vitals and nursing note reviewed.  Constitutional:      General: She is not in acute distress.    Appearance: She is not diaphoretic.  HENT:  Head: Normocephalic and atraumatic.     Comments: Gray hair.    Mouth/Throat:     Mouth: Mucous membranes are moist.     Pharynx: Oropharynx is clear.  Eyes:     General: No scleral icterus.    Extraocular Movements: Extraocular movements intact.     Conjunctiva/sclera: Conjunctivae normal.     Pupils: Pupils are equal, round, and reactive to light.     Comments: Blue eyes.  Cardiovascular:     Rate and Rhythm: Normal rate and regular rhythm.     Heart sounds: Normal heart sounds. No murmur heard.   Pulmonary:     Effort: Pulmonary effort is normal. No respiratory distress.     Breath sounds: Normal breath sounds. No wheezing or rales.  Chest:     Chest wall: No tenderness.  Breasts:     Right: No axillary adenopathy or supraclavicular adenopathy.     Left: No axillary adenopathy or supraclavicular adenopathy.    Abdominal:     General: Bowel sounds are normal. There is no distension.     Palpations: Abdomen is soft. There is no mass.     Tenderness: There is no abdominal tenderness. There is no guarding or rebound.  Musculoskeletal:        General: No swelling or tenderness. Normal range of motion.     Cervical back: Normal range of motion and neck supple.  Lymphadenopathy:     Head:     Right side of head: No preauricular,  posterior auricular or occipital adenopathy.     Left side of head: No preauricular, posterior auricular or occipital adenopathy.     Cervical: No cervical adenopathy.     Upper Body:     Right upper body: No supraclavicular or axillary adenopathy.     Left upper body: No supraclavicular or axillary adenopathy.     Lower Body: No right inguinal adenopathy. No left inguinal adenopathy.  Skin:    General: Skin is warm and dry.     Comments: A few bruises on upper extremities.  Neurological:     Mental Status: She is alert and oriented to person, place, and time. Mental status is at baseline.  Psychiatric:        Behavior: Behavior normal.        Thought Content: Thought content normal.        Judgment: Judgment normal.    Appointment on 10/22/2020  Component Date Value Ref Range Status  . WBC 10/22/2020 14.2* 4.0 - 10.5 K/uL Final  . RBC 10/22/2020 5.00  3.87 - 5.11 MIL/uL Final  . Hemoglobin 10/22/2020 14.0  12.0 - 15.0 g/dL Final  . HCT 10/22/2020 42.1  36.0 - 46.0 % Final  . MCV 10/22/2020 84.2  80.0 - 100.0 fL Final  . MCH 10/22/2020 28.0  26.0 - 34.0 pg Final  . MCHC 10/22/2020 33.3  30.0 - 36.0 g/dL Final  . RDW 10/22/2020 13.3  11.5 - 15.5 % Final  . Platelets 10/22/2020 270  150 - 400 K/uL Final  . nRBC 10/22/2020 0.0  0.0 - 0.2 % Final  . Neutrophils Relative % 10/22/2020 66  % Final  . Neutro Abs 10/22/2020 9.5* 1.7 - 7.7 K/uL Final  . Lymphocytes Relative 10/22/2020 24  % Final  . Lymphs Abs 10/22/2020 3.4  0.7 - 4.0 K/uL Final  . Monocytes Relative 10/22/2020 7  % Final  . Monocytes Absolute 10/22/2020 1.0  0.1 - 1.0 K/uL Final  . Eosinophils Relative 10/22/2020  1  % Final  . Eosinophils Absolute 10/22/2020 0.1  0.0 - 0.5 K/uL Final  . Basophils Relative 10/22/2020 1  % Final  . Basophils Absolute 10/22/2020 0.1  0.0 - 0.1 K/uL Final  . Immature Granulocytes 10/22/2020 1  % Final  . Abs Immature Granulocytes 10/22/2020 0.08* 0.00 - 0.07 K/uL Final   Performed at  [facility], [address] [place], [place] [address]    Assessment:  Rebecca Frost is a 65 y.o. female with an elevated hemoglobin.  Hemoglobin has ranged from 13.7 - 15.0 since 10/2019.  She smokes 1 pack/day.  She denies any cardiac issues.  She does not take testosterone.  She has sleep apnea and uses a CPAP machine. She does not have any cardiac issues  Labs on 05/05/2020 revealed a hematocrit 45.0, hemoglobin 15.0, MCV 90, platelets 306,000, WBC 13,600 (ANC 9,200).  PT/INR was 11.5/0.99 and PTT of 31.5. Protein C total antigen (> 95%), protein S total antigen (121%), and anti-thrombin antigen (105%) were normal. 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  Work-up on 07/15/2020 revealed a hematocrit of 45.4, hemoglobin 15.4, platelets 253,000, WBC 12,700 (ANC 8,500). Carbon monoxide was 7.9% (0-3.6%). Platelet function assay was normal.  Von Willebrand panel was negative ( factor VIII was 262%, ristocetin co-factor 181%, and von Willebrand antigen 236%).  PT and PTT were normal.  She has a bleeding diathesis.  She has bled easily for her whole life. She bleed for hours with minor trauma. She does not take aspirin or ibuprofen. She describes excess bleeding with a total hysterectomy, dental extraction, tonsillectomy, lower back surgery, and cholecystectomy. She had a blood transfusion when she was young.  She has a tumor or a cyst in the back of her neck that requires surgery.   Symptomatically, she feels "good".  She denies any increased bruising over baseline.  Exam is stable.  Hemoglobin is 14.0.  Plan: 1.   Labs today: CBC with diff. 2.   History of an elevated hemoglobin             Hemoglobin has ranged between 13.7-15.0 since 10/2019.             Hemoglobin is 14.0 (normal) today.  Work-up revealed an elevated carbon monoxide level secondary to  smoking.  She does not have classic erythrocytosis as her hemoglobin is < 16.             Etiology of her slightly elevated hemoglobin is likely due to smoking and/or sleep apnea.             Encourage smoking cessation. 3.   Bleeding diathesis             She notes excessive bleeding with multiple surgeries.             She denies aspirin or ibuprofen use.             Testing revealed a normal PT, PTT, PFA, and von Willebrand panel.  Discuss consideration of additional testing at [facility] consultation:   Platelet aggregation studies, electron microscopy, and alpha-2 antiplasmin activity.  Recent referral to [facility] benign hematology (coagulation). 4.   Smoking history  Patient has a 30 pack year smoking history.  Patient interested in the low-dose chest CT screening program. 5.   [facility] benign hematology (coag) referral (re-send). 6.   RN:  Call Burgess Estelle  re: low dose chest CT program. 7.   RTC in 6 months for MD assessment and labs (CBC with diff).  I discussed the assessment and treatment plan with the patient.  The patient was provided an opportunity to ask questions and all were answered.  The patient agreed with the plan and demonstrated an understanding of the instructions.  The patient was advised to call back if the symptoms worsen or if the condition fails to improve as anticipated.  I provided 14 minutes of face-to-face time during this this encounter and > 50% was spent counseling as documented under my assessment and plan.  An additional 5 minutes were spent reviewing her chart (Epic and Care Everywhere) including notes, labs, and imaging studies.    Nyesha Cliff C. Mike Gip, MD, PhD    10/22/2020, 3:49 PM  I, Mirian Mo Tufford, am acting as Education administrator for [employer]. Mike Gip, MD, PhD.  I, Roseanna Koplin C. Mike Gip, MD, have reviewed the above documentation for accuracy and completeness, and I agree with the above.

## 2020-10-22 ENCOUNTER — Encounter: Payer: Self-pay | Admitting: Hematology and Oncology

## 2020-10-22 ENCOUNTER — Inpatient Hospital Stay: Payer: Medicare HMO

## 2020-10-22 ENCOUNTER — Other Ambulatory Visit: Payer: Self-pay | Admitting: Hematology and Oncology

## 2020-10-22 ENCOUNTER — Other Ambulatory Visit: Payer: Self-pay

## 2020-10-22 ENCOUNTER — Inpatient Hospital Stay: Payer: Medicare HMO | Attending: Hematology and Oncology | Admitting: Hematology and Oncology

## 2020-10-22 VITALS — BP 125/65 | HR 85 | Temp 98.0°F | Resp 16 | Wt 214.3 lb

## 2020-10-22 DIAGNOSIS — D751 Secondary polycythemia: Secondary | ICD-10-CM

## 2020-10-22 DIAGNOSIS — F17210 Nicotine dependence, cigarettes, uncomplicated: Secondary | ICD-10-CM | POA: Diagnosis not present

## 2020-10-22 DIAGNOSIS — D699 Hemorrhagic condition, unspecified: Secondary | ICD-10-CM

## 2020-10-22 DIAGNOSIS — G4730 Sleep apnea, unspecified: Secondary | ICD-10-CM | POA: Insufficient documentation

## 2020-10-22 DIAGNOSIS — D582 Other hemoglobinopathies: Secondary | ICD-10-CM

## 2020-10-22 LAB — CBC WITH DIFFERENTIAL/PLATELET
Abs Immature Granulocytes: 0.08 10*3/uL — ABNORMAL HIGH (ref 0.00–0.07)
Basophils Absolute: 0.1 10*3/uL (ref 0.0–0.1)
Basophils Relative: 1 %
Eosinophils Absolute: 0.1 10*3/uL (ref 0.0–0.5)
Eosinophils Relative: 1 %
HCT: 42.1 % (ref 36.0–46.0)
Hemoglobin: 14 g/dL (ref 12.0–15.0)
Immature Granulocytes: 1 %
Lymphocytes Relative: 24 %
Lymphs Abs: 3.4 10*3/uL (ref 0.7–4.0)
MCH: 28 pg (ref 26.0–34.0)
MCHC: 33.3 g/dL (ref 30.0–36.0)
MCV: 84.2 fL (ref 80.0–100.0)
Monocytes Absolute: 1 10*3/uL (ref 0.1–1.0)
Monocytes Relative: 7 %
Neutro Abs: 9.5 10*3/uL — ABNORMAL HIGH (ref 1.7–7.7)
Neutrophils Relative %: 66 %
Platelets: 270 10*3/uL (ref 150–400)
RBC: 5 MIL/uL (ref 3.87–5.11)
RDW: 13.3 % (ref 11.5–15.5)
WBC: 14.2 10*3/uL — ABNORMAL HIGH (ref 4.0–10.5)
nRBC: 0 % (ref 0.0–0.2)

## 2020-10-22 NOTE — Progress Notes (Signed)
Patient states she wants to know her blood type.

## 2020-10-23 ENCOUNTER — Telehealth: Payer: Self-pay | Admitting: *Deleted

## 2020-10-23 NOTE — Telephone Encounter (Signed)
Pt received moderna 1st dose 01-23-2020, 2nd dose 02-19-2020 and moderna booster 08-26-2020

## 2020-11-03 ENCOUNTER — Telehealth: Payer: Self-pay | Admitting: *Deleted

## 2020-11-03 DIAGNOSIS — Z87891 Personal history of nicotine dependence: Secondary | ICD-10-CM

## 2020-11-03 DIAGNOSIS — F17200 Nicotine dependence, unspecified, uncomplicated: Secondary | ICD-10-CM

## 2020-11-03 DIAGNOSIS — Z122 Encounter for screening for malignant neoplasm of respiratory organs: Secondary | ICD-10-CM

## 2020-11-03 NOTE — Telephone Encounter (Signed)
Received referral for initial lung cancer screening scan. Contacted patient and obtained smoking history,(current, 47 pack year) as well as answering questions related to screening process. Patient denies signs of lung cancer such as weight loss or hemoptysis. Patient denies comorbidity that would prevent curative treatment if lung cancer were found. Patient is scheduled for shared decision making visit and CT scan on 11/18/20 at 1045am.

## 2020-11-06 DIAGNOSIS — G4733 Obstructive sleep apnea (adult) (pediatric): Secondary | ICD-10-CM | POA: Insufficient documentation

## 2020-11-06 DIAGNOSIS — I10 Essential (primary) hypertension: Secondary | ICD-10-CM | POA: Insufficient documentation

## 2020-11-06 DIAGNOSIS — F17200 Nicotine dependence, unspecified, uncomplicated: Secondary | ICD-10-CM | POA: Insufficient documentation

## 2020-11-06 DIAGNOSIS — E119 Type 2 diabetes mellitus without complications: Secondary | ICD-10-CM | POA: Insufficient documentation

## 2020-11-17 ENCOUNTER — Encounter (INDEPENDENT_AMBULATORY_CARE_PROVIDER_SITE_OTHER): Payer: Medicare HMO | Admitting: Internal Medicine

## 2020-11-17 ENCOUNTER — Inpatient Hospital Stay: Payer: Medicare HMO | Attending: Nurse Practitioner | Admitting: Nurse Practitioner

## 2020-11-17 DIAGNOSIS — F17210 Nicotine dependence, cigarettes, uncomplicated: Secondary | ICD-10-CM | POA: Diagnosis not present

## 2020-11-17 DIAGNOSIS — Z87891 Personal history of nicotine dependence: Secondary | ICD-10-CM

## 2020-11-17 DIAGNOSIS — G4733 Obstructive sleep apnea (adult) (pediatric): Secondary | ICD-10-CM | POA: Diagnosis not present

## 2020-11-17 NOTE — Progress Notes (Signed)
Virtual Visit via Video Enabled Telemedicine Note   I connected with Rebecca Frost on 11/17/20 at 2:30 PM EST by video enabled telemedicine visit and verified that I am speaking with the correct person using two identifiers.   I discussed the limitations, risks, security and privacy concerns of performing an evaluation and management service by telemedicine and the availability of in-person appointments. I also discussed with the patient that there may be a patient responsible charge related to this service. The patient expressed understanding and agreed to proceed.   Other persons participating in the visit and their role in the encounter: Burgess Estelle, RN- checking in patient & navigation  Patient's location: home  Provider's location: Clinic  Chief Complaint: Low Dose CT Screening  Patient agreed to evaluation by telemedicine to discuss shared decision making for consideration of low dose CT lung cancer screening.    In accordance with CMS guidelines, patient has met eligibility criteria including age, absence of signs or symptoms of lung cancer.  Social History   Tobacco Use  . Smoking status: Current Every Day Smoker    Packs/day: 1.00    Years: 30.00    Pack years: 30.00    Types: Cigarettes  . Smokeless tobacco: Never Used  Substance Use Topics  . Alcohol use: Not Currently     A shared decision-making session was conducted prior to the performance of CT scan. This includes one or more decision aids, includes benefits and harms of screening, follow-up diagnostic testing, over-diagnosis, false positive rate, and total radiation exposure.   Counseling on the importance of adherence to annual lung cancer LDCT screening, impact of co-morbidities, and ability or willingness to undergo diagnosis and treatment is imperative for compliance of the program.   Counseling on the importance of continued smoking cessation for former smokers; the importance of smoking cessation for  current smokers, and information about tobacco cessation interventions have been given to patient including [place] and [facility] [employer] [place] programs.   Written order for lung cancer screening with LDCT has been given to the patient and any and all questions have been answered to the best of my abilities.    Yearly follow up will be coordinated by Burgess Estelle, Thoracic Navigator.  I discussed the assessment and treatment plan with the patient. The patient was provided an opportunity to ask questions and all were answered. The patient agreed with the plan and demonstrated an understanding of the instructions.   The patient was advised to call back or seek an in-person evaluation if the symptoms worsen or if the condition fails to improve as anticipated.   I provided 15 minutes of face-to-face video visit time dedicated to the care of this patient on the date of this encounter to include pre-visit review of smoking history, face-to-face time with the patient, and post visit ordering of testing/documentation.   Beckey Rutter, DNP, AGNP-C [place] at [facility] [phone] (clinic)

## 2020-11-18 ENCOUNTER — Inpatient Hospital Stay: Payer: Medicare HMO | Admitting: Oncology

## 2020-11-18 ENCOUNTER — Other Ambulatory Visit: Payer: Self-pay

## 2020-11-18 ENCOUNTER — Ambulatory Visit: Admission: RE | Admit: 2020-11-18 | Payer: Medicare HMO | Source: Ambulatory Visit

## 2020-11-18 ENCOUNTER — Encounter: Payer: Self-pay | Admitting: *Deleted

## 2020-11-18 ENCOUNTER — Ambulatory Visit: Payer: Medicare HMO

## 2020-11-18 ENCOUNTER — Ambulatory Visit
Admission: RE | Admit: 2020-11-18 | Discharge: 2020-11-18 | Disposition: A | Discharge status: Home / Self Care | Payer: Medicare HMO | Source: Ambulatory Visit | Attending: Oncology | Admitting: Oncology

## 2020-11-18 DIAGNOSIS — Z122 Encounter for screening for malignant neoplasm of respiratory organs: Secondary | ICD-10-CM | POA: Insufficient documentation

## 2020-11-18 DIAGNOSIS — F17200 Nicotine dependence, unspecified, uncomplicated: Secondary | ICD-10-CM | POA: Diagnosis present

## 2020-11-18 DIAGNOSIS — Z87891 Personal history of nicotine dependence: Secondary | ICD-10-CM | POA: Diagnosis present

## 2020-11-26 ENCOUNTER — Encounter: Payer: Self-pay | Admitting: *Deleted

## 2020-11-29 NOTE — Procedures (Signed)
[facility] Report Part I                                                               Phone: [phone] Fax: [phone]  Patient Name: Haydee, Jabbour Acquisition Number: [id number]  Date of Birth: [birth year] Acquisition Date: 11/17/2020  Referring Physician: Eda Paschal, AGNP     History: The patient is a 65 year old female who was referred for re-evaluation of sleep apnea. Medical History: OSA, diabetes, hypertension, COPD, bipolar.  Medications: Did not have list of medications.  Procedure: This routine overnight polysomnogram was performed on the [facility] 5 using the standard diagnostic protocol. This included 6 channels of EEG, 2 channels of EOG, chin EMG, bilateral anterior tibialis EMG, nasal/oral thermistor, PTAF (nasal pressure transducer), chest and abdominal wall movements, EKG, and pulse oximetry.  Description: The total recording time was 452.5 minutes. The total sleep time was 347.0 minutes. There were a total of 79.3 minutes of wakefulness after sleep onset for a?reduced??sleep efficiency of 76.7%. The latency to sleep onset was within normal limits at 26.2 minutes. The R sleep onset latency was??prolonged at 182.0 minutes. Sleep parameters, as a percentage of the total sleep time, demonstrated 4.3% of sleep was in N1 sleep, 82.6% N2, 6.9% N3 and 6.2% R sleep. There were a total of 26 arousals for an arousal index of 4.5 arousals per hour of sleep that was normal.???  Respiratory monitoring demonstrated frequent moderate degree of snoring in all positions. There were 243 apneas and hypopneas for an Apnea Hypopnea Index of 42.0 apneas and hypopneas per hour of sleep. The REM related apnea hypopnea index was 83.7/hr of REM sleep compared to a NREM AHI of 38.2/hr.  The average duration of the respiratory events was 27.6 seconds with a maximum duration of 90.5 seconds. The respiratory events occurred in all positions. The  respiratory events were associated with peripheral oxygen desaturations on the average to 89%. The lowest oxygen desaturation associated with a respiratory event was 81%. Additionally, the baseline oxygen saturation during wakefulness was 93%, during NREM sleep averaged 92%, and during REM sleep averaged 93%. The total duration of oxygen < 90% was 83.7 minutes and <80% was 0.0 minutes.  Cardiac monitoring- did not demonstrate transient cardiac decelerations associated with the apneas. There were no significant cardiac rhythm irregularities.   Periodic limb movement monitoring- did not demonstrate periodic limb movements.   Impression: ???This routine overnight polysomnogram confirms the presence of severe obstructive sleep apnea with an overall Apnea Hypopnea Index of 42.0 apneas and hypopneas per hour of sleep, which increased to 83.7 in REM sleep. As REM percentage was reduced and the patient is using CPAP at home,  the findings likely underestimate the severity of the sleep apnea.  There was a ?reduced sleep efficiency with??increased awakenings?and reduced percentages of REM and slow wave sleep. These findings would appear to be due to the obstructive sleep apnea.???  ??Recommendations:    1. A CPAP titration would be recommended due to the severity of the sleep apnea. 2. Additionally, would recommend weight loss in a patient with a BMI of 34.5.     Allyne Gee, MD, [facility] Diplomate ABMS-Pulmonary, Critical  Care and Sleep Medicine  Electronically reviewed and digitally signed      [facility] Report Part II  Phone: [phone] Fax: [phone]  Patient last name Ebling Neck Size 14.0 in. Acquisition [id number]  Patient first name Cinthia Weight 214.0 lbs. Started 11/17/2020 at 9:09:43 PM  Birth date 1956/03/09 Height 66.0 in. Stopped 11/18/2020 at 4:52:25 AM  Age 17 BMI 34.5 lb/in2 Duration 452.5  Study Type Adult      Report generated by Adriana Mccallum,  RPSGT Sleep Data: Lights Out: 9:17:01 PM Sleep Onset: 9:43:13 PM  Lights On: 4:49:31 AM Sleep Efficiency: 76.7 %  Total Recording Time: 452.5 min Sleep Latency (from Lights Off) 26.2 min  Total Sleep Time (TST): 347.0 min R Latency (from Sleep Onset): 182.0 min  Sleep Period Time: 420.5 min Total number of awakenings: 27  Wake during sleep: 73.5 min Wake After Sleep Onset (WASO): 79.3 min   Sleep Data:         Arousal Summary: Stage  Latency from lights out (min) Latency from sleep onset (min) Duration (min) % Total Sleep Time  Normal values  N 1 26.2 0.0 15.0 4.3 (5%)  N 2 27.7 1.5 286.5 82.6 (50%)  N 3 44.2 18.0 24.0 6.9 (20%)  R 208.2 182.0 21.5 6.2 (25%)    Number Index  Spontaneous 72 12.4  Apneas & Hypopneas 80 13.8  RERAs 0 0.0       (Apneas & Hypopneas & RERAs)  (80) (13.8)  Limb Movement 0 0.0  Snore 0 0.0  TOTAL 152 26.3      Respiratory Data:  CA OA MA Apnea Hypopnea* A+ H RERA Total  Number 0 112 0 112 131 243 0 243  Mean Dur (sec) 0.0 21.2 0.0 21.2 33.0 27.6 0.0 27.6  Max Dur (sec) 0.0 42.5 0.0 42.5 90.5 90.5 0.0 90.5  Total Dur (min) 0.0 39.6 0.0 39.6 72.0 111.6 0.0 111.6  % of TST 0.0 11.4 0.0 11.4 20.8 32.2 0.0 32.2  Index (#/h TST) 0.0 19.4 0.0 19.4 22.7 42.0 0.0 42.0  *Hypopneas scored based on 4% or greater desaturation.  Sleep Stage:        REM NREM TST  AHI 83.7 38.2 42.0  RDI 83.7 38.2 42.0            Body Position Data:  Sleep (min) TST (%) REM (min) NREM (min) CA (#) OA (#) MA (#) HYP (#) AHI (#/h) RERA (#) RDI (#/h) Desat (#)  Supine 249.1 71.79 1.0 248.1 0 82 0 87 40.7 0 40.7 157  Non-Supine 97.90 28.21 20.50 77.40 0.00 30.00 0.00 44.00 45.35 0 45.35 84.00  Left: 97.9 28.21 20.5 77.4 0 30 0 44 45.4 0 45.4 84  Right: 0.0 0.00 0.0 0.0 0 0 0 0 0.0 0 0.00 0  UP: 0.0 0.00 0.0 0.0 0 0 0 0 0.0 0 0.00 0     Snoring: Total number of snoring episodes  0  Total time with snoring    min (   % of sleep)   Oximetry  Distribution:             WK REM NREM TOTAL  Average (%)   93 93 92 92  < 90% 9.8 2.2 71.7 83.7  < 80% 0.0 0.0 0.0 0.0  < 70% 0.0 0.0 0.0 0.0  # of Desaturations* 12 31 197 240  Desat Index (#/hour) 7.8 86.5 42.3 47.8  Desat Max (%) 7 12 12  12  Desat Max Dur (sec) 59.0 47.0 91.0 91.0  Approx Min O2 during sleep 81  Approx min O2 during a respiratory event 81  Was Oxygen added (Y/N) and final rate No:   0 LPM  *Desaturations based on 4% or greater drop from baseline.   Cheyne Stokes Breathing: None Present   Heart Rate Summary:  Average Heart Rate During Sleep 66.5 bpm      Highest Heart Rate During Sleep (95th %) 78.0 bpm      Highest Heart Rate During Sleep 128 bpm      Highest Heart Rate During Recording (TIB) 205 bpm (artifact)   Heart Rate Observations: Event Type # Events   Bradycardia 0 Lowest HR Scored: N/A  Sinus Tachycardia During Sleep 0 Highest HR Scored: N/A  Narrow Complex Tachycardia 0 Highest HR Scored: N/A  Wide Complex Tachycardia 0 Highest HR Scored: N/A  Asystole 0 Longest Pause: N/A  Atrial Fibrillation 0 Duration Longest Event: N/A  Other Arrythmias  No Type:    Periodic Limb Movement Data: (Primary legs unless otherwise noted) Total # Limb Movement 0 Limb Movement Index 0.0  Total # PLMS    PLMS Index     Total # PLMS Arousals    PLMS Arousal Index     Percentage Sleep Time with PLMS   min (   % sleep)  Mean Duration limb movements (secs)

## 2021-04-12 ENCOUNTER — Ambulatory Visit (INDEPENDENT_AMBULATORY_CARE_PROVIDER_SITE_OTHER): Payer: Medicare HMO | Admitting: Internal Medicine

## 2021-04-12 VITALS — BP 117/69 | HR 67 | Resp 16 | Ht 67.0 in | Wt 195.6 lb

## 2021-04-12 DIAGNOSIS — Z9989 Dependence on other enabling machines and devices: Secondary | ICD-10-CM

## 2021-04-12 DIAGNOSIS — Z7189 Other specified counseling: Secondary | ICD-10-CM | POA: Diagnosis not present

## 2021-04-12 DIAGNOSIS — E6609 Other obesity due to excess calories: Secondary | ICD-10-CM | POA: Diagnosis not present

## 2021-04-12 DIAGNOSIS — G4733 Obstructive sleep apnea (adult) (pediatric): Secondary | ICD-10-CM

## 2021-04-12 DIAGNOSIS — Z6830 Body mass index (BMI) 30.0-30.9, adult: Secondary | ICD-10-CM

## 2021-04-12 DIAGNOSIS — F319 Bipolar disorder, unspecified: Secondary | ICD-10-CM | POA: Insufficient documentation

## 2021-04-12 NOTE — Progress Notes (Signed)
[facility] [place], [place] [address]  Pulmonary Sleep Medicine   Office Visit Note  Patient Name: Rebecca Frost DOB: 1956/04/03 MRN [id number]    Chief Complaint: Obstructive Sleep Apnea visit  Brief History:  Rebecca Frost is seen today for initial consult to establish care.  Patient is currently on APAP @ 4-20cmH2O. The patient has a 25 year history of sleep apnea. With original symptoms of very loud snoring, gasping for air & restless sleep prior to therapy.  Patient is using PAP nightly.  The patient feels much better  after sleeping with PAP.  The patient reports symptoms ar lots better since being on PAP therapy from PAP use. Reported sleepiness is  usually daily when she doesn't nap due to caring for a toddler grandson. and the Epworth Sleepiness Score is 13 out of 24. The patient does take naps 1-2 hours daily. The patient complains of nothing.  Reviewed cleaning/disinfecting water chamber because it has a slimy build up.  The compliance download shows  compliance with an average use time of 9:06 hours @ 97% hours. The AHI is 0.9  The patient does not complain of limb movements disrupting sleep.  ROS  General: (-) fever, (-) chills, (-) night sweat Nose and Sinuses: (-) nasal stuffiness or itchiness, (-) postnasal drip, (-) nosebleeds, (-) sinus trouble. Mouth and Throat: (-) sore throat, (-) hoarseness. Neck: (-) swollen glands, (-) enlarged thyroid, (-) neck pain. Respiratory: - cough, - shortness of breath, - wheezing. Neurologic: - numbness, - tingling. Psychiatric: + anxiety, + depression, has diagnosis of bipolar disorder   Current Medication: Outpatient Encounter Medications as of 04/12/2021  Medication Sig   ammonium lactate (LAC-HYDRIN) 12 % lotion Apply topically.   Cholecalciferol (VITAMIN D3) 100000 UNIT/GM POWD Take by mouth.   CRANBERRY PO Take by mouth.   Cranberry POWD Take by mouth.   DULoxetine (CYMBALTA) 60 MG capsule duloxetine  60 mg capsule,delayed release   empagliflozin (JARDIANCE) 25 MG TABS tablet Jardiance 25 mg tablet   gabapentin (NEURONTIN) 300 MG capsule Take 300 mg by mouth 3 (three) times daily. Patient reports she is taking 2-4 pills a day   LATUDA 80 MG TABS tablet Take 80 mg by mouth at bedtime.   lidocaine (XYLOCAINE) 5 % ointment Apply topically.   lisinopril (ZESTRIL) 10 MG tablet lisinopril 10 mg tablet   lurasidone (LATUDA) 40 MG TABS tablet Latuda 40 mg tablet   metFORMIN (GLUCOPHAGE) 500 MG tablet Take 1,000 mg by mouth 2 (two) times daily.   NEXLIZET 180-10 MG TABS Take 1 tablet by mouth daily.   Omega-3 Fatty Acids (FISH OIL) 1000 MG CAPS omega-3 acid ethyl esters 1 gram capsule   QUEtiapine (SEROQUEL) 100 MG tablet Take 100 mg by mouth at bedtime.   RYBELSUS 14 MG TABS Take 1 tablet by mouth every morning.   Turmeric (QC TUMERIC COMPLEX PO) Take by mouth.   VITAMIN D PO Take by mouth.   vitamin k 100 MCG tablet Take 100 mcg by mouth daily.   [DISCONTINUED] gemfibrozil (LOPID) 600 MG tablet gemfibrozil 600 mg tablet (Patient not taking: Reported on 10/22/2020)   No facility-administered encounter medications on file as of 04/12/2021.    Surgical History: Past Surgical History:  Procedure Laterality Date   BACK SURGERY     CHOLECYSTECTOMY     TONSILLECTOMY     age 65   TOTAL ABDOMINAL HYSTERECTOMY      Medical History: Past Medical History:  Diagnosis Date  Allergy    Anxiety    Bipolar 1 disorder (HCC)    Cardiomegaly    COPD (chronic obstructive pulmonary disease) (HCC)    Depression    Diabetes mellitus without complication (HCC)    Fibrocystic breast    Goiter, nontoxic, multinodular    Hyperlipidemia    Hypertension    IgM deficiency (HCC)    Obesity    Sleep apnea    Thyroid nodule    Tobacco abuse    Tumor    on the back of neck, and right ankle per pt   Vitamin D deficiency     Family History: Non contributory to the present illness  Social  History: Social History   Socioeconomic History   Marital status: Divorced    Spouse name: Not on file   Number of children: Not on file   Years of education: Not on file   Highest education level: Not on file  Occupational History   Not on file  Tobacco Use   Smoking status: Every Day    Packs/day: 1.00    Years: 47.00    Pack years: 47.00    Types: Cigarettes   Smokeless tobacco: Never  Vaping Use   Vaping Use: Never used  Substance and Sexual Activity   Alcohol use: Not Currently   Drug use: Never   Sexual activity: Not on file  Other Topics Concern   Not on file  Social History Narrative   Not on file   Social Determinants of Health   Financial Resource Strain: Not on file  Food Insecurity: Not on file  Transportation Needs: Not on file  Physical Activity: Not on file  Stress: Not on file  Social Connections: Not on file  Intimate Partner Violence: Not on file    Vital Signs: Blood pressure 117/69, pulse 67, resp. rate 16, height '5\' 7"'$  (1.702 m), weight 195 lb 9.6 oz (88.7 kg), SpO2 96 %.  Examination: General Appearance: The patient is well-developed, well-nourished, and in no distress. Neck Circumference: 38 Skin: Gross inspection of skin unremarkable. Head: normocephalic, no gross deformities. Eyes: no gross deformities noted. ENT: ears appear grossly normal Neurologic: Alert and oriented. No involuntary movements.    EPWORTH SLEEPINESS SCALE:  Scale:  (0)= no chance of dozing; (1)= slight chance of dozing; (2)= moderate chance of dozing; (3)= high chance of dozing  Chance  Situtation    Sitting and reading: 2    Watching TV: 2    Sitting Inactive in public: 1    As a passenger in car: 3      Lying down to rest: 3    Sitting and talking: 1    Sitting quielty after lunch: 1    In a car, stopped in traffic: 0   TOTAL SCORE:   13 out of 24    SLEEP STUDIES:  PSG 11/17/20 -   AHI 42, REM AHI 83.7, Low SpO2 81%   CPAP COMPLIANCE  DATA:  Date Range: 03/11/21-04/09/21  Average Daily Use: 9:06 hours  Median Use: 9:12 hours  Compliance for > 4 Hours: 97% days  AHI: 0.9 respiratory events per hour  Days Used: 30/30 days  Mask Leak: 34.1 lpm  95th Percentile Pressure: 11.9 cmH2O         LABS: No results found for this or any previous visit (from the past 2160 hour(s)).  Radiology: CT CHEST LUNG CANCER SCREENING LOW DOSE WO CONTRAST  Result Date: 11/18/2020 CLINICAL DATA:  Lung cancer screening.  Current asymptomatic smoker with 47 pack-year history. EXAM: CT CHEST WITHOUT CONTRAST LOW-DOSE FOR LUNG CANCER SCREENING TECHNIQUE: Multidetector CT imaging of the chest was performed following the standard protocol without IV contrast. COMPARISON:  None. FINDINGS: Cardiovascular: The heart size is within normal limits. No pericardial effusion identified. Aortic atherosclerosis. Mediastinum/Nodes: No enlarged mediastinal, hilar, or axillary lymph nodes. Thyroid gland, trachea, and esophagus demonstrate no significant findings. Lungs/Pleura: Mild centrilobular emphysema. No pleural effusion, airspace consolidation, or atelectasis. Small scattered lung nodules are identified in both lungs. The largest nodule is in the posterior right middle lobe with an equivalent diameter of 5.4 mm. No suspicious lung nodules identified at this time. Upper Abdomen: No acute abnormality. Previous cholecystectomy. Aortic atherosclerosis. Musculoskeletal: No chest wall mass or suspicious bone lesions identified. Degenerative changes noted within the thoracic spine. IMPRESSION: 1. Lung-RADS 2, benign appearance or behavior. Continue annual screening with low-dose chest CT without contrast in 12 months. 2. Emphysema and aortic atherosclerosis. Aortic Atherosclerosis (ICD10-I70.0) and Emphysema (ICD10-J43.9). Electronically Signed   By: Kerby Moors M.D.   On: 11/18/2020 15:47    No results found.  No results found.    Assessment and  Plan: Patient Active Problem List   Diagnosis Date Noted   Bipolar disorder, unspecified ([place]) 04/12/2021   OSA on CPAP 04/12/2021   CPAP use counseling 04/12/2021   Essential (primary) hypertension 11/06/2020   Obstructive sleep apnea (adult) (pediatric) 11/06/2020   Tobacco dependence 11/06/2020   Erythrocytosis 07/15/2020   Elevated hemoglobin (HCC) 07/15/2020   Bleeding diathesis ([place]) 07/15/2020   Cervical radiculopathy 02/26/2019   Diabetes mellitus ([place]) 02/26/2019   Heartburn 10/19/2015   Pure hypertriglyceridemia 10/19/2015   Obesity, unspecified 09/08/2015   Type 2 diabetes mellitus with diabetic neuropathy, unspecified ([place]) 08/26/2014   1. OSA on CPAP The patient does  tolerate PAP and reports definite benefit from PAP use. The patient was reminded how to clean equipment  and advised to replace supplies routinely. The patient was also counselled on weight loss. The compliance is excellent . The AHI is 0.9.   OSA- continue excellent compliance with cpap, f/u one year.    2. CPAP use counseling CPAP Counseling: had a lengthy discussion with the patient regarding the importance of PAP therapy in management of the sleep apnea. Patient appears to understand the risk factor reduction and also understands the risks associated with untreated sleep apnea. Patient will try to make a good faith effort to remain compliant with therapy. Also instructed the patient on proper cleaning of the device including the water must be changed daily if possible and use of distilled water is preferred. Patient understands that the machine should be regularly cleaned with appropriate recommended cleaning solutions that do not damage the PAP machine for example given white vinegar and water rinses. Other methods such as ozone treatment may not be as good as these simple methods to achieve cleaning.   3. Class 1 obesity due to excess calories with serious comorbidity and body mass index (BMI) of 30.0 to  30.9 in adult Obesity Counseling: Had a lengthy discussion regarding patients BMI and weight issues. Patient was instructed on portion control as well as increased activity. Also discussed caloric restrictions with trying to maintain intake less than 2000 Kcal. Discussions were made in accordance with the 5As of weight management. Simple actions such as not eating late and if able to, taking a walk is suggested.     General Counseling: I have discussed the findings of the evaluation and  examination with Mardene Celeste.  I have also discussed any further diagnostic evaluation thatmay be needed or ordered today. Bhavana verbalizes understanding of the findings of todays visit. We also reviewed her medications today and discussed drug interactions and side effects including but not limited excessive drowsiness and altered mental states. We also discussed that there is always a risk not just to her but also people around her. she has been encouraged to call the office with any questions or concerns that should arise related to todays visit.  No orders of the defined types were placed in this encounter.       I have personally obtained a history, examined the patient, evaluated laboratory and imaging results, formulated the assessment and plan and placed orders.   This patient was seen today by Tressie Ellis, PA-C in collaboration with Dr. Devona Konig.    Allyne Gee, MD [facility] Diplomate ABMS Pulmonary and Critical Care Medicine Sleep medicine

## 2021-04-12 NOTE — Patient Instructions (Signed)

## 2021-04-20 ENCOUNTER — Other Ambulatory Visit: Payer: Self-pay

## 2021-04-20 DIAGNOSIS — Z122 Encounter for screening for malignant neoplasm of respiratory organs: Secondary | ICD-10-CM

## 2021-04-20 DIAGNOSIS — Z87891 Personal history of nicotine dependence: Secondary | ICD-10-CM

## 2021-04-21 ENCOUNTER — Other Ambulatory Visit: Payer: Self-pay

## 2021-04-21 ENCOUNTER — Inpatient Hospital Stay: Payer: Medicare HMO | Attending: Oncology

## 2021-04-21 ENCOUNTER — Inpatient Hospital Stay (HOSPITAL_BASED_OUTPATIENT_CLINIC_OR_DEPARTMENT_OTHER): Payer: Medicare HMO | Admitting: Oncology

## 2021-04-21 VITALS — BP 118/51 | HR 72 | Temp 97.8°F | Resp 18 | Wt 198.0 lb

## 2021-04-21 DIAGNOSIS — D751 Secondary polycythemia: Secondary | ICD-10-CM | POA: Diagnosis present

## 2021-04-21 DIAGNOSIS — G4730 Sleep apnea, unspecified: Secondary | ICD-10-CM | POA: Diagnosis not present

## 2021-04-21 DIAGNOSIS — D699 Hemorrhagic condition, unspecified: Secondary | ICD-10-CM | POA: Diagnosis not present

## 2021-04-21 DIAGNOSIS — D689 Coagulation defect, unspecified: Secondary | ICD-10-CM | POA: Diagnosis not present

## 2021-04-21 DIAGNOSIS — Z122 Encounter for screening for malignant neoplasm of respiratory organs: Secondary | ICD-10-CM

## 2021-04-21 DIAGNOSIS — F17210 Nicotine dependence, cigarettes, uncomplicated: Secondary | ICD-10-CM | POA: Diagnosis not present

## 2021-04-21 LAB — CBC WITH DIFFERENTIAL/PLATELET
Abs Immature Granulocytes: 0.05 10*3/uL (ref 0.00–0.07)
Basophils Absolute: 0.1 10*3/uL (ref 0.0–0.1)
Basophils Relative: 1 %
Eosinophils Absolute: 0.2 10*3/uL (ref 0.0–0.5)
Eosinophils Relative: 1 %
HCT: 40.7 % (ref 36.0–46.0)
Hemoglobin: 14.3 g/dL (ref 12.0–15.0)
Immature Granulocytes: 1 %
Lymphocytes Relative: 36 %
Lymphs Abs: 4 10*3/uL (ref 0.7–4.0)
MCH: 30.1 pg (ref 26.0–34.0)
MCHC: 35.1 g/dL (ref 30.0–36.0)
MCV: 85.7 fL (ref 80.0–100.0)
Monocytes Absolute: 0.7 10*3/uL (ref 0.1–1.0)
Monocytes Relative: 6 %
Neutro Abs: 6.2 10*3/uL (ref 1.7–7.7)
Neutrophils Relative %: 55 %
Platelets: 245 10*3/uL (ref 150–400)
RBC: 4.75 MIL/uL (ref 3.87–5.11)
RDW: 12.7 % (ref 11.5–15.5)
WBC: 11.1 10*3/uL — ABNORMAL HIGH (ref 4.0–10.5)
nRBC: 0 % (ref 0.0–0.2)

## 2021-04-25 ENCOUNTER — Encounter: Payer: Self-pay | Admitting: Oncology

## 2021-04-25 NOTE — Progress Notes (Signed)
Hematology/Oncology Consult note [facility]  Telephone:([phone] [phone]  Patient Care Team: Gae Bon, NP as PCP - General (Nurse Practitioner) Lequita Asal, MD (Inactive) as PCP - Hematology/Oncology (Hematology and Oncology)   Name of the patient: Rebecca Frost  [id number]  09-24-1955   Date of visit: 04/25/21  Diagnosis- secondary polycythemia  Chief complaint/ Reason for visit-routine follow-up of polycythemia  Heme/Onc history: Patient is a 65 year old female with who saw Dr. Mike Gip in the past for her polycythemia which has been attributed to smoking and sleep apnea.  Carbon monoxide levels were elevated at 7.9%.  I do not see where a JAK2 mutation or EPO level has been checked in the past.  There was also concern for bleeding disorder and was seen by Dr. Gaylyn Cheers from [facility].  She was diagnosed with having platelet like bleeding disorder on platelet aggregation study at [facility].  Last seen by them in April 2022.  Prior to any invasive procedures such as dental extraction she was recommended DDAVP, platelet transfusion as well as IV tranexamic acid 30 to 60 minutes prior and tranexamic acid 1300 mg 3 times daily for 7 days postprocedure  Interval history-patient reports doing well presently.She does have some easy bruising.She uses CPAP for sleep apnea.  ECOG PS- 1 Pain scale- 0   Review of systems- Review of Systems  Constitutional:  Negative for chills, fever, malaise/fatigue and weight loss.  HENT:  Negative for congestion, ear discharge and nosebleeds.   Eyes:  Negative for blurred vision.  Respiratory:  Negative for cough, hemoptysis, sputum production, shortness of breath and wheezing.   Cardiovascular:  Negative for chest pain, palpitations, orthopnea and claudication.  Gastrointestinal:  Negative for abdominal pain, blood in stool, constipation, diarrhea, heartburn, melena, nausea and vomiting.  Genitourinary:   Negative for dysuria, flank pain, frequency, hematuria and urgency.  Musculoskeletal:  Negative for back pain, joint pain and myalgias.  Skin:  Negative for rash.  Neurological:  Negative for dizziness, tingling, focal weakness, seizures, weakness and headaches.  Endo/Heme/Allergies:  Bruises/bleeds easily.  Psychiatric/Behavioral:  Negative for depression and suicidal ideas. The patient does not have insomnia.      Allergies  Allergen Reactions   Nsaids     Other reaction(s): Flushing   Acetaminophen-Codeine     Other reaction(s): Unknown   Aspirin    Azithromycin    Celebrex [Celecoxib]    Codeine    Iodine    Levofloxacin Swelling   Lortab [Hydrocodone-Acetaminophen]    Niacin    Statins     Other reaction(s): Unknown   Sulfa Antibiotics     Patient states all antibiotics Patient states all antibiotics     Past Medical History:  Diagnosis Date   Allergy    Anxiety    Bipolar 1 disorder (HCC)    Cardiomegaly    COPD (chronic obstructive pulmonary disease) (HCC)    Depression    Diabetes mellitus without complication (HCC)    Fibrocystic breast    Goiter, nontoxic, multinodular    Hyperlipidemia    Hypertension    IgM deficiency (HCC)    Obesity    Sleep apnea    Thyroid nodule    Tobacco abuse    Tumor    on the back of neck, and right ankle per pt   Vitamin D deficiency      Past Surgical History:  Procedure Laterality Date   BACK SURGERY     CHOLECYSTECTOMY  TONSILLECTOMY     age 10   TOTAL ABDOMINAL HYSTERECTOMY      Social History   Socioeconomic History   Marital status: Divorced    Spouse name: Not on file   Number of children: Not on file   Years of education: Not on file   Highest education level: Not on file  Occupational History   Not on file  Tobacco Use   Smoking status: Every Day    Packs/day: 1.00    Years: 47.00    Pack years: 47.00    Types: Cigarettes   Smokeless tobacco: Never  Vaping Use   Vaping Use: Never used   Substance and Sexual Activity   Alcohol use: Not Currently   Drug use: Never   Sexual activity: Not on file  Other Topics Concern   Not on file  Social History Narrative   Not on file   Social Determinants of Health   Financial Resource Strain: Not on file  Food Insecurity: Not on file  Transportation Needs: Not on file  Physical Activity: Not on file  Stress: Not on file  Social Connections: Not on file  Intimate Partner Violence: Not on file    Family History  Problem Relation Age of Onset   Hypertension Mother    Diabetes Mother    Heart murmur Mother    Hypertension Father    Cancer Brother    Cancer Maternal Grandmother    Stroke Half-Brother    Diabetes Half-Brother    Cancer Half-Sister      Current Outpatient Medications:    ammonium lactate (LAC-HYDRIN) 12 % lotion, Apply topically., Disp: , Rfl:    Cholecalciferol (VITAMIN D3) 100000 UNIT/GM POWD, Take by mouth., Disp: , Rfl:    CRANBERRY PO, Take by mouth., Disp: , Rfl:    Cranberry POWD, Take by mouth., Disp: , Rfl:    DULoxetine (CYMBALTA) 60 MG capsule, duloxetine 60 mg capsule,delayed release, Disp: , Rfl:    empagliflozin (JARDIANCE) 25 MG TABS tablet, Jardiance 25 mg tablet, Disp: , Rfl:    gabapentin (NEURONTIN) 300 MG capsule, Take 300 mg by mouth 3 (three) times daily. Patient reports she is taking 2-4 pills a day, Disp: , Rfl:    LATUDA 80 MG TABS tablet, Take 80 mg by mouth at bedtime., Disp: , Rfl:    lidocaine (XYLOCAINE) 5 % ointment, Apply topically., Disp: , Rfl:    lisinopril (ZESTRIL) 10 MG tablet, lisinopril 10 mg tablet, Disp: , Rfl:    lurasidone (LATUDA) 40 MG TABS tablet, Latuda 40 mg tablet, Disp: , Rfl:    metFORMIN (GLUCOPHAGE) 500 MG tablet, Take 1,000 mg by mouth 2 (two) times daily., Disp: , Rfl:    NEXLIZET 180-10 MG TABS, Take 1 tablet by mouth daily., Disp: , Rfl:    Omega-3 Fatty Acids (FISH OIL) 1000 MG CAPS, omega-3 acid ethyl esters 1 gram capsule, Disp: , Rfl:     QUEtiapine (SEROQUEL) 100 MG tablet, Take 100 mg by mouth at bedtime., Disp: , Rfl:    RYBELSUS 14 MG TABS, Take 1 tablet by mouth every morning., Disp: , Rfl:    VITAMIN D PO, Take by mouth., Disp: , Rfl:    Turmeric (QC TUMERIC COMPLEX PO), Take by mouth. (Patient not taking: Reported on 04/21/2021), Disp: , Rfl:    vitamin k 100 MCG tablet, Take 100 mcg by mouth daily. (Patient not taking: Reported on 04/21/2021), Disp: , Rfl:   Physical exam:  Vitals:   04/21/21  1447  BP: (!) 118/51  Pulse: 72  Resp: 18  Temp: 97.8 F (36.6 C)  SpO2: 96%  Weight: 197 lb 15.6 oz (89.8 kg)   Physical Exam Constitutional:      General: She is not in acute distress. Cardiovascular:     Rate and Rhythm: Normal rate and regular rhythm.     Heart sounds: Normal heart sounds.  Pulmonary:     Effort: Pulmonary effort is normal.     Breath sounds: Normal breath sounds.  Abdominal:     General: Bowel sounds are normal.     Palpations: Abdomen is soft.  Skin:    General: Skin is warm and dry.  Neurological:     Mental Status: She is alert and oriented to person, place, and time.     CMP Latest Ref Rng & Units 07/15/2020  Glucose 70 - 99 mg/dL 210(H)  BUN 8 - 23 mg/dL 11  Creatinine 0.44 - 1.00 mg/dL 0.67  Sodium 135 - 145 mmol/L 137  Potassium 3.5 - 5.1 mmol/L 4.0  Chloride 98 - 111 mmol/L 100  CO2 22 - 32 mmol/L 25  Calcium 8.9 - 10.3 mg/dL 9.8  Total Protein 6.5 - 8.1 g/dL 7.8  Total Bilirubin 0.3 - 1.2 mg/dL 0.5  Alkaline Phos 38 - 126 U/L 69  AST 15 - 41 U/L 15  ALT 0 - 44 U/L 15   CBC Latest Ref Rng & Units 04/21/2021  WBC 4.0 - 10.5 K/uL 11.1(H)  Hemoglobin 12.0 - 15.0 g/dL 14.3  Hematocrit 36.0 - 46.0 % 40.7  Platelets 150 - 400 K/uL 245     Assessment and plan- Patient is a 65 y.o. female who is here for follow-up of following issues:  Polycythemia: Presently her hemoglobin is stable around 14 for the last 6 months.  Prior to that she had a mildly elevated level of 15.4.  At this  time I am planning to With me for polycythemia with if there is a steady increase in her hemoglobin she can be referred to [place] in the futureobserve this conservatively without any further JAK2 or EPO testing.She does not require a follow-up  2.  Aspirin like platelet defect bleeding disorder: She has been seen by Dr. Gaylyn Cheers from [facility] and was last seen by her in April 2022.  She will be following up with them at the hem[facility] treatment center once a year.  She therefore does not require a follow-up with me at this time for this issue   Visit Diagnosis 1. Polycythemia, secondary   2. Bleeding disorder ([place])      Dr. Randa Evens, MD, MPH [facility] [id number] 04/25/2021 1:16 PM

## 2021-07-03 IMAGING — CT CT CHEST LUNG CANCER SCREENING LOW DOSE W/O CM
1 of 2 series · 10 of 20 positions shown, 13 images · non-contrast
Comparison: None.

CLINICAL DATA: Lung cancer screening. Current asymptomatic smoker
with 47 pack-year history.

EXAM:
CT CHEST WITHOUT CONTRAST LOW-DOSE FOR LUNG CANCER SCREENING
TECHNIQUE: Multidetector CT imaging of the chest was performed following the
standard protocol without IV contrast.

[ct lung segmentation data · axial · 0.69mm/px · z∈[-679,-679]mm · 10 of 297 frames shown]
[frame 1/297  mediastinal]
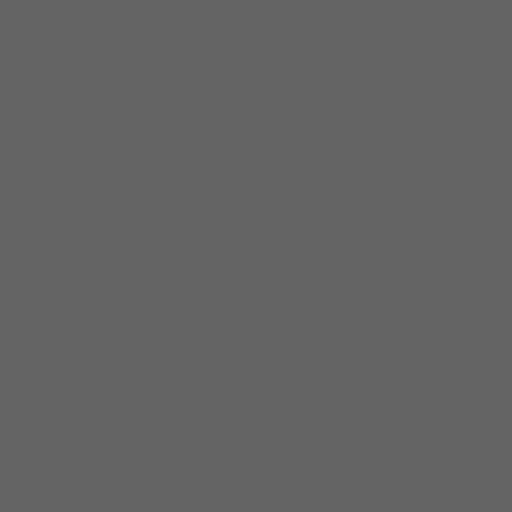
[frame 1/297  lung]
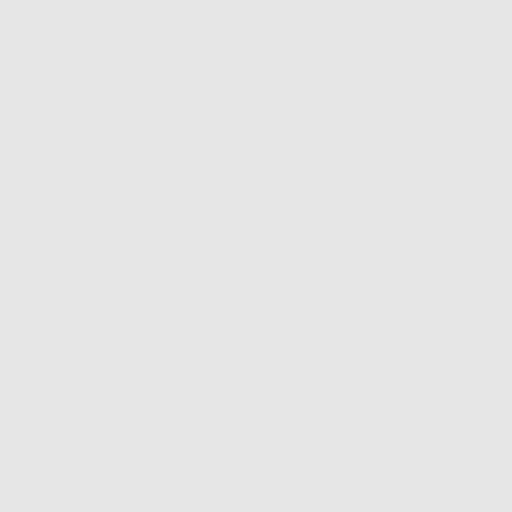
[frame 33/297  lung]
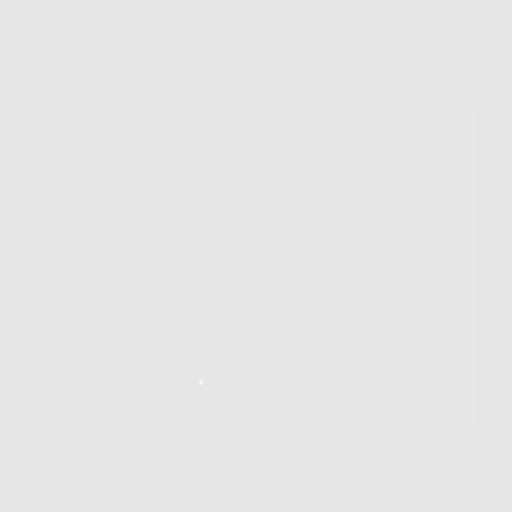
[frame 66/297  lung]
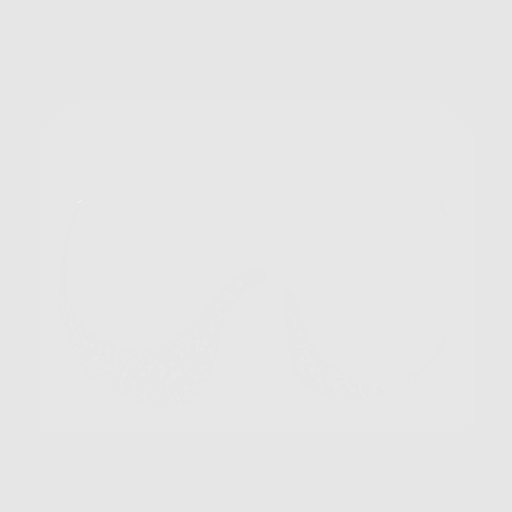
[frame 99/297  lung]
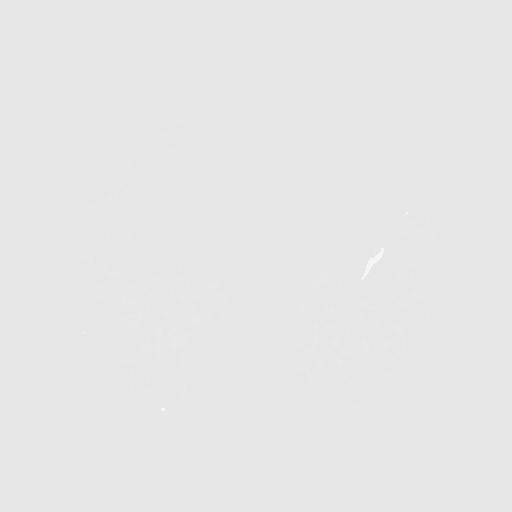
[frame 132/297  mediastinal]
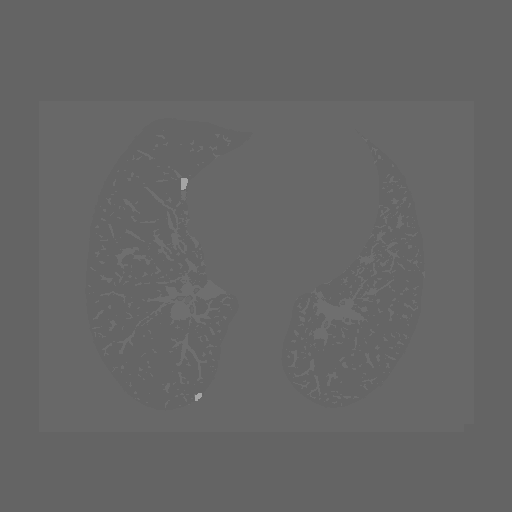
[frame 132/297  lung]
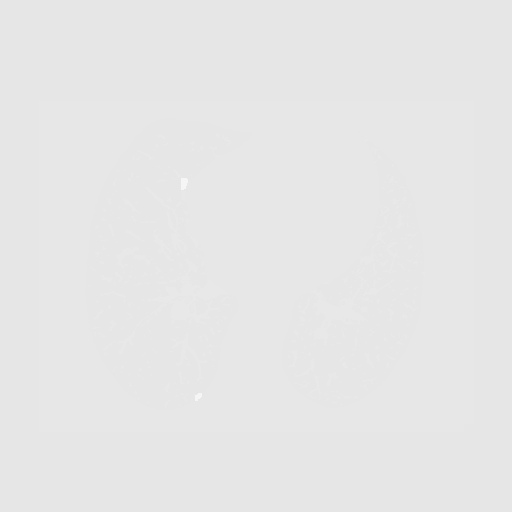
[frame 165/297  lung]
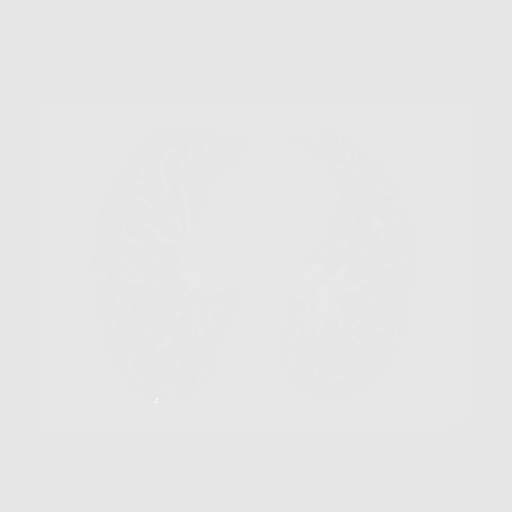
[frame 198/297  lung]
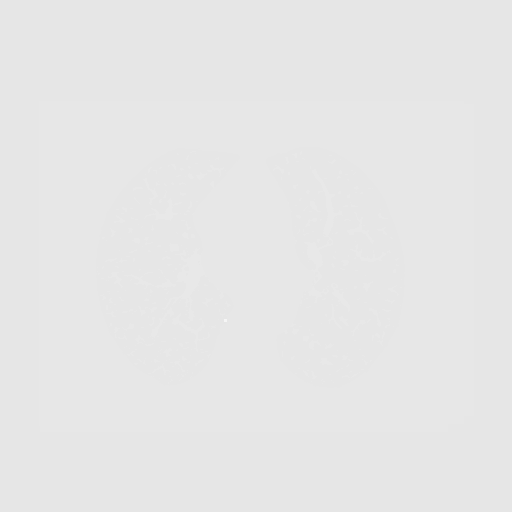
[frame 231/297  lung]
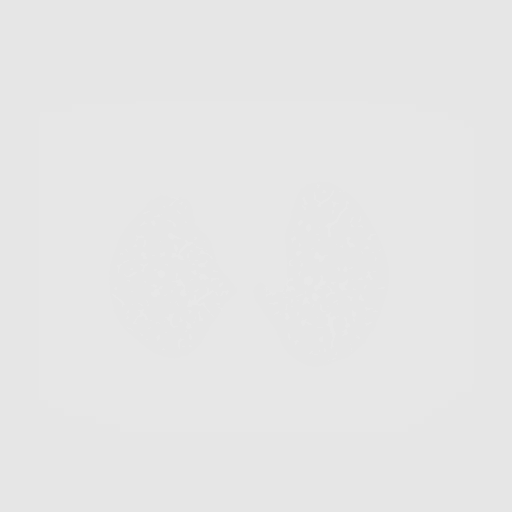
[frame 264/297  mediastinal]
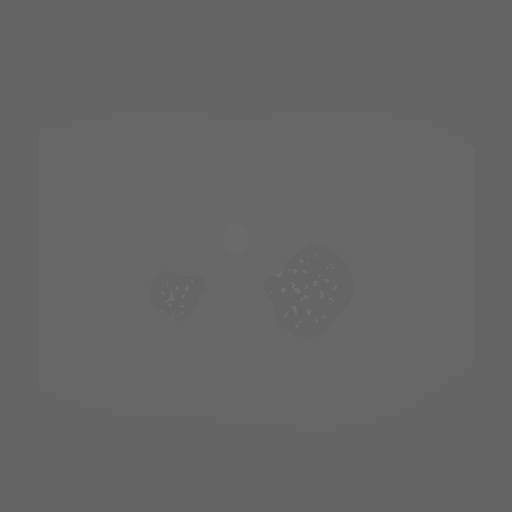
[frame 264/297  lung]
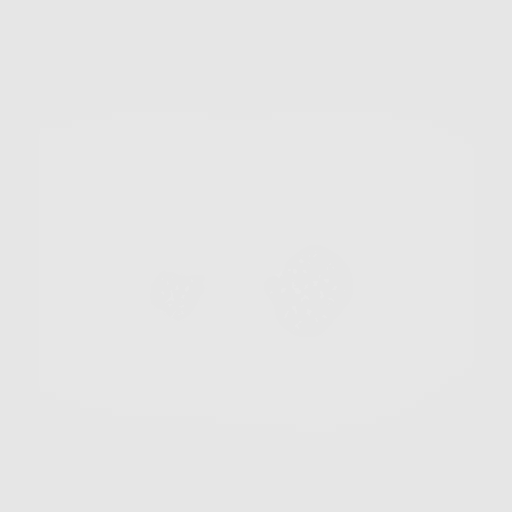
[frame 297/297  lung]
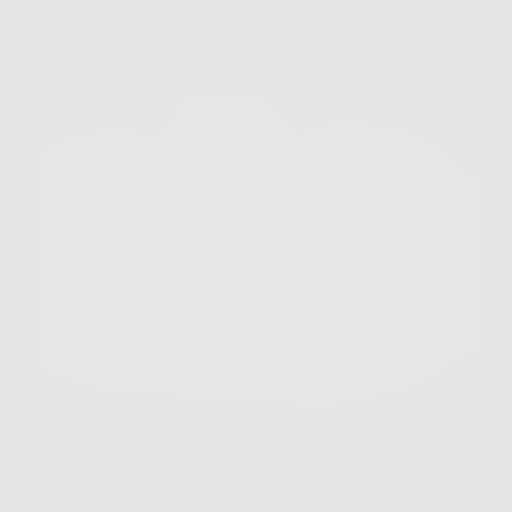

[10 of 20 positions shown; findings below may reference images not displayed]

FINDINGS: Cardiovascular: The heart size is within normal limits. No
pericardial effusion identified. Aortic atherosclerosis.

Mediastinum/Nodes: No enlarged mediastinal, hilar, or axillary lymph
nodes. Thyroid gland, trachea, and esophagus demonstrate no
significant findings.

Lungs/Pleura: Mild centrilobular emphysema. No pleural effusion,
airspace consolidation, or atelectasis. Small scattered lung nodules
are identified in both lungs. The largest nodule is in the posterior
right middle lobe with an equivalent diameter of 5.4 mm. No
suspicious lung nodules identified at this time.

Upper Abdomen: No acute abnormality. Previous cholecystectomy.
Aortic atherosclerosis.

Musculoskeletal: No chest wall mass or suspicious bone lesions
identified. Degenerative changes noted within the thoracic spine.
IMPRESSION: 1. Lung-RADS 2, benign appearance or behavior. Continue annual
screening with low-dose chest CT without contrast in 12 months.
2. Emphysema and aortic atherosclerosis.

Aortic Atherosclerosis (OH3ST-NFD.D) and Emphysema (OH3ST-61O.C).

## 2021-12-14 ENCOUNTER — Other Ambulatory Visit: Payer: Self-pay | Admitting: Adult Health

## 2021-12-14 DIAGNOSIS — Z1231 Encounter for screening mammogram for malignant neoplasm of breast: Secondary | ICD-10-CM

## 2022-01-07 ENCOUNTER — Other Ambulatory Visit: Payer: Self-pay | Admitting: *Deleted

## 2022-01-07 DIAGNOSIS — Z87891 Personal history of nicotine dependence: Secondary | ICD-10-CM

## 2022-01-07 DIAGNOSIS — Z122 Encounter for screening for malignant neoplasm of respiratory organs: Secondary | ICD-10-CM

## 2022-01-07 DIAGNOSIS — F17210 Nicotine dependence, cigarettes, uncomplicated: Secondary | ICD-10-CM

## 2022-01-18 ENCOUNTER — Ambulatory Visit
Admission: RE | Admit: 2022-01-18 | Discharge: 2022-01-18 | Disposition: A | Discharge status: Home / Self Care | Payer: Medicare HMO | Source: Ambulatory Visit | Attending: Acute Care | Admitting: Acute Care

## 2022-01-18 DIAGNOSIS — Z87891 Personal history of nicotine dependence: Secondary | ICD-10-CM | POA: Diagnosis present

## 2022-01-18 DIAGNOSIS — Z122 Encounter for screening for malignant neoplasm of respiratory organs: Secondary | ICD-10-CM | POA: Diagnosis present

## 2022-01-18 DIAGNOSIS — F17210 Nicotine dependence, cigarettes, uncomplicated: Secondary | ICD-10-CM | POA: Insufficient documentation

## 2022-01-19 ENCOUNTER — Other Ambulatory Visit: Payer: Self-pay

## 2022-01-19 DIAGNOSIS — Z87891 Personal history of nicotine dependence: Secondary | ICD-10-CM

## 2022-01-19 DIAGNOSIS — Z122 Encounter for screening for malignant neoplasm of respiratory organs: Secondary | ICD-10-CM

## 2022-01-19 DIAGNOSIS — F17210 Nicotine dependence, cigarettes, uncomplicated: Secondary | ICD-10-CM

## 2022-04-11 ENCOUNTER — Ambulatory Visit (INDEPENDENT_AMBULATORY_CARE_PROVIDER_SITE_OTHER): Payer: Medicare HMO | Admitting: Internal Medicine

## 2022-04-11 VITALS — Ht 66.0 in | Wt 177.0 lb

## 2022-04-11 DIAGNOSIS — Z9989 Dependence on other enabling machines and devices: Secondary | ICD-10-CM

## 2022-04-11 DIAGNOSIS — F17210 Nicotine dependence, cigarettes, uncomplicated: Secondary | ICD-10-CM | POA: Diagnosis not present

## 2022-04-11 DIAGNOSIS — G4733 Obstructive sleep apnea (adult) (pediatric): Secondary | ICD-10-CM

## 2022-04-11 DIAGNOSIS — F32A Depression, unspecified: Secondary | ICD-10-CM | POA: Insufficient documentation

## 2022-04-11 DIAGNOSIS — Z7189 Other specified counseling: Secondary | ICD-10-CM | POA: Diagnosis not present

## 2022-04-11 NOTE — Patient Instructions (Signed)

## 2022-04-11 NOTE — Progress Notes (Unsigned)
[facility] [place], [place] [address]  Pulmonary Sleep Medicine   Office Visit Note  Patient Name: Rebecca Frost DOB: 06-08-1956 MRN [id number]    Chief Complaint: Obstructive Sleep Apnea visit  Brief History:  Rebecca Frost is seen today for an annual follow up visit for APAP@ 4-20 cmH2O. The patient has a 26 year history of sleep apnea. Patient is using PAP nightly.  The patient feels rested after sleeping with PAP.  The patient reports benefiting from PAP use. Patient continues to require PAP therapy in order to eliminate her sleep apnea. Reported sleepiness is  improved and the Epworth Sleepiness Score is 12 out of 24. The patient does take naps. The patient complains of the following: no complaints.   The compliance download shows 100% compliance with an average use time of 9 hours 25 minutes. The AHI is 1.1.  The patient does not complain of limb movements disrupting sleep.  ROS  General: (-) fever, (-) chills, (-) night sweat Nose and Sinuses: (-) nasal stuffiness or itchiness, (-) postnasal drip, (-) nosebleeds, (-) sinus trouble. Mouth and Throat: (-) sore throat, (-) hoarseness. Neck: (-) swollen glands, (-) enlarged thyroid, (-) neck pain. Respiratory: - cough, - shortness of breath, - wheezing. Neurologic: - numbness, - tingling. Psychiatric: - anxiety, - depression   Current Medication: Outpatient Encounter Medications as of 04/11/2022  Medication Sig   ammonium lactate (LAC-HYDRIN) 12 % lotion Apply topically.   buPROPion (WELLBUTRIN SR) 150 MG 12 hr tablet    Cholecalciferol (VITAMIN D3) 100000 UNIT/GM POWD Take by mouth.   CRANBERRY PO Take by mouth.   Cranberry POWD Take by mouth.   DULoxetine (CYMBALTA) 60 MG capsule duloxetine 60 mg capsule,delayed release   empagliflozin (JARDIANCE) 25 MG TABS tablet Jardiance 25 mg tablet   esomeprazole (NEXIUM) 40 MG capsule Take 40 mg by mouth every morning.   fluticasone (FLONASE) 50 MCG/ACT  nasal spray Place 2 sprays into both nostrils daily.   gabapentin (NEURONTIN) 300 MG capsule Take 300 mg by mouth 3 (three) times daily. Patient reports she is taking 2-4 pills a day   LATUDA 80 MG TABS tablet Take 80 mg by mouth at bedtime.   lidocaine (XYLOCAINE) 5 % ointment Apply topically.   lisinopril (ZESTRIL) 10 MG tablet lisinopril 10 mg tablet   lurasidone (LATUDA) 40 MG TABS tablet Latuda 40 mg tablet   NEXLIZET 180-10 MG TABS Take 1 tablet by mouth daily.   Omega-3 Fatty Acids (FISH OIL) 1000 MG CAPS omega-3 acid ethyl esters 1 gram capsule   QUEtiapine (SEROQUEL) 100 MG tablet Take 100 mg by mouth at bedtime.   RYBELSUS 14 MG TABS Take 1 tablet by mouth every morning.   Turmeric (QC TUMERIC COMPLEX PO) Take by mouth. (Patient not taking: Reported on 04/21/2021)   VITAMIN D PO Take by mouth.   vitamin k 100 MCG tablet Take 100 mcg by mouth daily. (Patient not taking: Reported on 04/21/2021)   [DISCONTINUED] metFORMIN (GLUCOPHAGE) 500 MG tablet Take 1,000 mg by mouth 2 (two) times daily.   No facility-administered encounter medications on file as of 04/11/2022.    Surgical History: Past Surgical History:  Procedure Laterality Date   BACK SURGERY     CHOLECYSTECTOMY     TONSILLECTOMY     age 49   TOTAL ABDOMINAL HYSTERECTOMY      Medical History: Past Medical History:  Diagnosis Date   Allergy    Anxiety    Bipolar 1 disorder ([place])  Cardiomegaly    COPD (chronic obstructive pulmonary disease) (HCC)    Depression    Diabetes mellitus without complication (HCC)    Fibrocystic breast    Goiter, nontoxic, multinodular    Hyperlipidemia    Hypertension    IgM deficiency (HCC)    Obesity    Sleep apnea    Thyroid nodule    Tobacco abuse    Tumor    on the back of neck, and right ankle per pt   Vitamin D deficiency     Family History: Non contributory to the present illness  Social History: Social History   Socioeconomic History   Marital status: Divorced     Spouse name: Not on file   Number of children: Not on file   Years of education: Not on file   Highest education level: Not on file  Occupational History   Not on file  Tobacco Use   Smoking status: Every Day    Packs/day: 1.00    Years: 47.00    Total pack years: 47.00    Types: Cigarettes   Smokeless tobacco: Never  Vaping Use   Vaping Use: Never used  Substance and Sexual Activity   Alcohol use: Not Currently   Drug use: Never   Sexual activity: Not on file  Other Topics Concern   Not on file  Social History Narrative   Not on file   Social Determinants of Health   Financial Resource Strain: Not on file  Food Insecurity: Not on file  Transportation Needs: Not on file  Physical Activity: Not on file  Stress: Not on file  Social Connections: Not on file  Intimate Partner Violence: Not on file    Vital Signs: Height '5\' 6"'$  (1.676 m), weight 177 lb (80.3 kg). Body mass index is 28.57 kg/m.    Examination: General Appearance: The patient is well-developed, well-nourished, and in no distress. Neck Circumference: 39 cm Skin: Gross inspection of skin unremarkable. Head: normocephalic, no gross deformities. Eyes: no gross deformities noted. ENT: ears appear grossly normal Neurologic: Alert and oriented. No involuntary movements.    EPWORTH SLEEPINESS SCALE:  Scale:  (0)= no chance of dozing; (1)= slight chance of dozing; (2)= moderate chance of dozing; (3)= high chance of dozing  Chance  Situtation    Sitting and reading: 1    Watching TV: 1    Sitting Inactive in public: 2    As a passenger in car: 3      Lying down to rest: 3    Sitting and talking: 1    Sitting quielty after lunch: 2    In a car, stopped in traffic: 0   TOTAL SCORE:   12 out of 24    SLEEP STUDIES:  PSG (11/2020) AHI 42/hr, REM AHI 83.7/hr, min SpO2 81%   CPAP COMPLIANCE DATA:  Date Range: 04/08/2021-04/07/2022  Average Daily Use: 9 hours 25 minutes  Median Use: 9  hours 29 minutes  Compliance for > 4 Hours: 100%  AHI: 1.1 respiratory events per hour  Days Used: 365/365 days  Mask Leak: 1.1  95th Percentile Pressure: 10.4         LABS: No results found for this or any previous visit (from the past 2160 hour(s)).  Radiology: CT CHEST LUNG CA SCREEN LOW DOSE W/O CM  Result Date: 01/18/2022 CLINICAL DATA:  Current smoker with 47 pack-year history EXAM: CT CHEST WITHOUT CONTRAST LOW-DOSE FOR LUNG CANCER SCREENING TECHNIQUE: Multidetector CT imaging of the chest  was performed following the standard protocol without IV contrast. RADIATION DOSE REDUCTION: This exam was performed according to the departmental dose-optimization program which includes automated exposure control, adjustment of the mA and/or kV according to patient size and/or use of iterative reconstruction technique. COMPARISON:  Lung cancer screening CT dated November 18, 2020 FINDINGS: Cardiovascular: Normal heart size. No pericardial effusion. Atherosclerotic disease of the thoracic aorta. Mediastinum/Nodes: Esophagus is unremarkable. No pathologically enlarged lymph nodes seen in the chest. Lungs/Pleura: Central airways are patent. Mild centrilobular emphysema. No consolidation, pleural effusion or pneumothorax. Stable bilateral solid pulmonary nodules. Reference nodule of the right middle lobe measuring 5.5 mm in mean diameter on image 177. Upper Abdomen: Cholecystectomy clips.  No acute abnormality. Musculoskeletal: No chest wall mass or suspicious bone lesions identified. IMPRESSION: 1. Lung-RADS 2, benign appearance or behavior. Continue annual screening with low-dose chest CT without contrast in 12 months. 2. Aortic Atherosclerosis (ICD10-I70.0) and Emphysema (ICD10-J43.9). Electronically Signed   By: Yetta Glassman M.D.   On: 01/18/2022 16:03   No results found.  No results found.    Assessment and Plan: Patient Active Problem List   Diagnosis Date Noted   Moderate smoker (20  or less per day) 04/11/2022   Depression 04/11/2022   Bipolar disorder, unspecified ([place]) 04/12/2021   OSA on CPAP 04/12/2021   CPAP use counseling 04/12/2021   Essential (primary) hypertension 11/06/2020   Obstructive sleep apnea (adult) (pediatric) 11/06/2020   Tobacco dependence 11/06/2020   DM (diabetes mellitus), type 2 ([place]) 11/06/2020   Erythrocytosis 07/15/2020   Elevated hemoglobin ([place]) 07/15/2020   Bleeding diathesis ([place]) 07/15/2020   Cervical radiculopathy 02/26/2019   Diabetes mellitus ([place]) 02/26/2019   Heartburn 10/19/2015   Pure hypertriglyceridemia 10/19/2015   Obesity, unspecified 09/08/2015   Type 2 diabetes mellitus with diabetic neuropathy, unspecified ([place]) 08/26/2014    1. OSA on CPAP The patient does tolerate PAP and reports  benefit from PAP use. The patient was reminded how to clean equipment and advised to replace supplies routinely. The patient was also counselled on weight loss. The compliance is excellent. The AHI is 1.1.   OSA on cpap- CPAP continues to be medically necessary to treat this patient's OSA.  Continue with excellent compliance. F/u one year.   2. CPAP use counseling CPAP Counseling: had a lengthy discussion with the patient regarding the importance of PAP therapy in management of the sleep apnea. Patient appears to understand the risk factor reduction and also understands the risks associated with untreated sleep apnea. Patient will try to make a good faith effort to remain compliant with therapy. Also instructed the patient on proper cleaning of the device including the water must be changed daily if possible and use of distilled water is preferred. Patient understands that the machine should be regularly cleaned with appropriate recommended cleaning solutions that do not damage the PAP machine for example given white vinegar and water rinses. Other methods such as ozone treatment may not be as good as these simple methods to achieve cleaning.    3. Moderate smoker (20 or less per day) Counseled as to smoking cessation.    General Counseling: I have discussed the findings of the evaluation and examination with Rebecca Frost.  I have also discussed any further diagnostic evaluation thatmay be needed or ordered today. Rebecca Frost verbalizes understanding of the findings of todays visit. We also reviewed her medications today and discussed drug interactions and side effects including but not limited excessive drowsiness and altered mental states. We also  discussed that there is always a risk not just to her but also people around her. she has been encouraged to call the office with any questions or concerns that should arise related to todays visit.  No orders of the defined types were placed in this encounter.       I have personally obtained a history, examined the patient, evaluated laboratory and imaging results, formulated the assessment and plan and placed orders. This patient was seen today by Tressie Ellis, PA-C in collaboration with Dr. Devona Konig.   Allyne Gee, MD [facility] Diplomate ABMS Pulmonary Critical Care Medicine and Sleep Medicine

## 2023-01-17 ENCOUNTER — Other Ambulatory Visit: Payer: Self-pay

## 2023-01-17 DIAGNOSIS — Z87891 Personal history of nicotine dependence: Secondary | ICD-10-CM

## 2023-01-17 DIAGNOSIS — F17210 Nicotine dependence, cigarettes, uncomplicated: Secondary | ICD-10-CM

## 2023-01-17 DIAGNOSIS — Z122 Encounter for screening for malignant neoplasm of respiratory organs: Secondary | ICD-10-CM

## 2023-01-19 ENCOUNTER — Ambulatory Visit: Payer: Medicare HMO

## 2023-01-30 ENCOUNTER — Ambulatory Visit
Admission: RE | Admit: 2023-01-30 | Discharge: 2023-01-30 | Disposition: A | Discharge status: Home / Self Care | Payer: Medicare HMO | Source: Ambulatory Visit | Attending: Acute Care | Admitting: Acute Care

## 2023-01-30 DIAGNOSIS — Z87891 Personal history of nicotine dependence: Secondary | ICD-10-CM

## 2023-01-30 DIAGNOSIS — Z122 Encounter for screening for malignant neoplasm of respiratory organs: Secondary | ICD-10-CM

## 2023-01-30 DIAGNOSIS — F17210 Nicotine dependence, cigarettes, uncomplicated: Secondary | ICD-10-CM

## 2023-02-02 ENCOUNTER — Other Ambulatory Visit: Payer: Self-pay | Admitting: Acute Care

## 2023-02-02 DIAGNOSIS — Z122 Encounter for screening for malignant neoplasm of respiratory organs: Secondary | ICD-10-CM

## 2023-02-02 DIAGNOSIS — Z87891 Personal history of nicotine dependence: Secondary | ICD-10-CM

## 2023-02-02 DIAGNOSIS — F17210 Nicotine dependence, cigarettes, uncomplicated: Secondary | ICD-10-CM

## 2023-02-20 ENCOUNTER — Other Ambulatory Visit: Payer: Medicare HMO

## 2023-04-10 ENCOUNTER — Ambulatory Visit (INDEPENDENT_AMBULATORY_CARE_PROVIDER_SITE_OTHER): Payer: Medicare HMO | Admitting: Internal Medicine

## 2023-04-10 VITALS — Ht 66.0 in | Wt 180.0 lb

## 2023-04-10 DIAGNOSIS — Z7189 Other specified counseling: Secondary | ICD-10-CM | POA: Diagnosis not present

## 2023-04-10 DIAGNOSIS — I10 Essential (primary) hypertension: Secondary | ICD-10-CM

## 2023-04-10 DIAGNOSIS — G4733 Obstructive sleep apnea (adult) (pediatric): Secondary | ICD-10-CM | POA: Diagnosis not present

## 2023-04-10 NOTE — Progress Notes (Signed)
[facility] [address] [place], [place] [address]  Pulmonary Sleep Medicine   Office Visit Note  Patient Name: Rebecca Frost DOB: 1956/02/20 MRN [id number]  The visit was conducted by telemedicine video. The patient was identified using two identifiers and consented to the appointment being conducted by video. She is aware of the limitations of telemedicine.   Chief Complaint: Obstructive Sleep Apnea visit  Brief History:  Rebecca Frost is seen today for an annual follow up on APAP at 4-20 cmh20.  The patient has a 27 year history of sleep apnea. Patient is using PAP nightly.  The patient feels rested after sleeping with PAP.  The patient reports benefit from PAP use. Reported sleepiness is  improved and the Epworth Sleepiness Score is 8 out of 24. The patient does take naps. The patient complains of the following: no complaints.   The compliance download shows 99% compliance with an average use time of 8:53 hours. The AHI is 0.9.  The patient does not complain of limb movements disrupting sleep.  ROS  General: (-) fever, (-) chills, (-) night sweat Nose and Sinuses: (-) nasal stuffiness or itchiness, (-) postnasal drip, (-) nosebleeds, (-) sinus trouble. Mouth and Throat: (-) sore throat, (-) hoarseness. Neck: (-) swollen glands, (-) enlarged thyroid, (-) neck pain. Respiratory: - cough, - shortness of breath, - wheezing. Neurologic: - numbness, - tingling. Psychiatric: + anxiety, + depression   Current Medication: Outpatient Encounter Medications as of 04/10/2023  Medication Sig   esomeprazole (NEXIUM) 40 MG capsule Take 40 mg by mouth daily at 12 noon.   ammonium lactate (LAC-HYDRIN) 12 % lotion Apply topically.   Cholecalciferol (VITAMIN D3) 100000 UNIT/GM POWD Take by mouth.   CRANBERRY PO Take by mouth.   Cranberry POWD Take by mouth.   DULoxetine (CYMBALTA) 60 MG capsule duloxetine 60 mg capsule,delayed release   empagliflozin (JARDIANCE) 25 MG TABS tablet  Jardiance 25 mg tablet   esomeprazole (NEXIUM) 40 MG capsule Take 40 mg by mouth every morning.   fluticasone (FLONASE) 50 MCG/ACT nasal spray Place 2 sprays into both nostrils daily.   gabapentin (NEURONTIN) 300 MG capsule Take 300 mg by mouth 3 (three) times daily. Patient reports she is taking 2-4 pills a day   LATUDA 80 MG TABS tablet Take 80 mg by mouth at bedtime.   lidocaine (XYLOCAINE) 5 % ointment Apply topically.   lisinopril (ZESTRIL) 10 MG tablet lisinopril 10 mg tablet   NEXLIZET 180-10 MG TABS Take 1 tablet by mouth daily.   Omega-3 Fatty Acids (FISH OIL) 1000 MG CAPS omega-3 acid ethyl esters 1 gram capsule   QUEtiapine (SEROQUEL) 100 MG tablet Take 100 mg by mouth at bedtime.   RYBELSUS 14 MG TABS Take 1 tablet by mouth every morning.   VITAMIN D PO Take by mouth.   [DISCONTINUED] buPROPion (WELLBUTRIN SR) 150 MG 12 hr tablet    [DISCONTINUED] lurasidone (LATUDA) 40 MG TABS tablet Latuda 40 mg tablet   [DISCONTINUED] Turmeric (QC TUMERIC COMPLEX PO) Take by mouth. (Patient not taking: Reported on 04/21/2021)   [DISCONTINUED] vitamin k 100 MCG tablet Take 100 mcg by mouth daily. (Patient not taking: Reported on 04/21/2021)   No facility-administered encounter medications on file as of 04/10/2023.    Surgical History: Past Surgical History:  Procedure Laterality Date   BACK SURGERY     CHOLECYSTECTOMY     TONSILLECTOMY     age 23   TOTAL ABDOMINAL HYSTERECTOMY      Medical History:  Past Medical History:  Diagnosis Date   Allergy    Anxiety    Bipolar 1 disorder (HCC)    Cardiomegaly    COPD (chronic obstructive pulmonary disease) (HCC)    Depression    Diabetes mellitus without complication (HCC)    Fibrocystic breast    Goiter, nontoxic, multinodular    Hyperlipidemia    Hypertension    IgM deficiency (HCC)    Obesity    Sleep apnea    Thyroid nodule    Tobacco abuse    Tumor    on the back of neck, and right ankle per pt   Vitamin D deficiency      Family History: Non contributory to the present illness  Social History: Social History   Socioeconomic History   Marital status: Divorced    Spouse name: Not on file   Number of children: Not on file   Years of education: Not on file   Highest education level: Not on file  Occupational History   Not on file  Tobacco Use   Smoking status: Every Day    Current packs/day: 1.00    Average packs/day: 1 pack/day for 47.0 years (47.0 ttl pk-yrs)    Types: Cigarettes   Smokeless tobacco: Never  Vaping Use   Vaping status: Never Used  Substance and Sexual Activity   Alcohol use: Not Currently   Drug use: Never   Sexual activity: Not on file  Other Topics Concern   Not on file  Social History Narrative   Not on file   Social Determinants of Health   Financial Resource Strain: Not on file  Food Insecurity: Not on file  Transportation Needs: Not on file  Physical Activity: Not on file  Stress: Not on file  Social Connections: Not on file  Intimate Partner Violence: Not on file    Vital Signs: Height 5\' 6"  (1.676 m), weight 180 lb (81.6 kg). Body mass index is 29.05 kg/m.    Examination: General Appearance: The patient is well-developed, well-nourished, and in no distress. Neck Circumference:  Skin: Gross inspection of skin unremarkable. Head: normocephalic, no gross deformities. Eyes: no gross deformities noted. ENT: ears appear grossly normal Neurologic: Alert and oriented. No involuntary movements.  STOP BANG RISK ASSESSMENT S (snore) Have you been told that you snore?     YES/NO   T (tired) Are you often tired, fatigued, or sleepy during the day?   YES/NO  O (obstruction) Do you stop breathing, choke, or gasp during sleep? YES/NO   P (pressure) Do you have or are you being treated for high blood pressure? YES/NO   B (BMI) Is your body index greater than 35 kg/m? YES/NO   A (age) Are you 49 years old or older? YES   N (neck) Do you have a neck  circumference greater than 16 inches?   YES/NO   G (gender) Are you a female? NO   TOTAL STOP/BANG "YES" ANSWERS        A STOP-Bang score of 2 or less is considered low risk, and a score of 5 or more is high risk for having either moderate or severe OSA. For people who score 3 or 4, doctors may need to perform further assessment to determine how likely they are to have OSA.         EPWORTH SLEEPINESS SCALE:  Scale:  (0)= no chance of dozing; (1)= slight chance of dozing; (2)= moderate chance of dozing; (3)= high chance of dozing  Chance  Situtation    Sitting and reading: 1    Watching TV: 2    Sitting Inactive in public: 0    As a passenger in car: 2    Lying down to rest: 3    Sitting and talking: 0    Sitting quielty after lunch: 0    In a car, stopped in traffic: 0   TOTAL SCORE:   8 out of 24    SLEEP STUDIES:  PSG (03/22) AHI 42, REM AHI 83, min SPO2 81%   CPAP COMPLIANCE DATA:  Date Range: 04/07/23 - 04/05/23  Average Daily Use: 8:53 hours  Median Use: 9:05 hours  Compliance for > 4 Hours: 360 days  AHI: 0.9 respiratory events per hour  Days Used: 361/365  Mask Leak: 42.7  95th Percentile Pressure: 9.8 cmh20         LABS: No results found for this or any previous visit (from the past 2160 hour(s)).  Radiology: CT CHEST LUNG CA SCREEN LOW DOSE W/O CM  Result Date: 02/02/2023 CLINICAL DATA:  67 year old female current smoker with 48 pack-year smoking history. EXAM: CT CHEST WITHOUT CONTRAST LOW-DOSE FOR LUNG CANCER SCREENING TECHNIQUE: Multidetector CT imaging of the chest was performed following the standard protocol without IV contrast. RADIATION DOSE REDUCTION: This exam was performed according to the departmental dose-optimization program which includes automated exposure control, adjustment of the mA and/or kV according to patient size and/or use of iterative reconstruction technique. COMPARISON:  01/18/2022 screening chest CT.  FINDINGS: Cardiovascular: Normal heart size. No significant pericardial effusion/thickening. Left anterior descending coronary atherosclerosis. Atherosclerotic nonaneurysmal thoracic aorta. Normal caliber pulmonary arteries. Mediastinum/Nodes: No significant thyroid nodules. Unremarkable esophagus. No pathologically enlarged axillary, mediastinal or hilar lymph nodes, noting limited sensitivity for the detection of hilar adenopathy on this noncontrast study. Lungs/Pleura: No pneumothorax. No pleural effusion. Mild centrilobular emphysema with diffuse bronchial wall thickening. No acute consolidative airspace disease or lung masses. No significant growth of previously visualized scattered bilateral solid pulmonary nodules. No new significant pulmonary nodules. Upper abdomen: Cholecystectomy. Musculoskeletal: No aggressive appearing focal osseous lesions. Mild thoracic spondylosis. IMPRESSION: 1. Lung-RADS 2, benign appearance or behavior. Continue annual screening with low-dose chest CT without contrast in 12 months. 2. One vessel coronary atherosclerosis. 3. Aortic Atherosclerosis (ICD10-I70.0) and Emphysema (ICD10-J43.9). Electronically Signed   By: Delbert Phenix M.D.   On: 02/02/2023 10:30   No results found.  No results found.    Assessment and Plan: Patient Active Problem List   Diagnosis Date Noted   Moderate smoker (20 or less per day) 04/11/2022   Depression 04/11/2022   Bipolar disorder, unspecified (HCC) 04/12/2021   OSA on CPAP 04/12/2021   CPAP use counseling 04/12/2021   Essential (primary) hypertension 11/06/2020   Obstructive sleep apnea (adult) (pediatric) 11/06/2020   Tobacco dependence 11/06/2020   DM (diabetes mellitus), type 2 (HCC) 11/06/2020   Erythrocytosis 07/15/2020   Elevated hemoglobin (HCC) 07/15/2020   Bleeding diathesis (HCC) 07/15/2020   Cervical radiculopathy 02/26/2019   Diabetes mellitus (HCC) 02/26/2019   Heartburn 10/19/2015   Pure hypertriglyceridemia  10/19/2015   Obesity, unspecified 09/08/2015   Type 2 diabetes mellitus with diabetic neuropathy, unspecified (HCC) 08/26/2014    1. OSA on CPAP The patient does tolerate PAP and reports  benefit from PAP use. The patient was reminded how to clean equipment and advised to replace supplies routinely. The patient was also counselled on weight loss and smoking cessation. The compliance is excellent. The AHI is 0.9.  OSA on cpap- controlled. Continue with excellent compliance with pap. CPAP continues to be medically necessary to treat this patient's OSA. F/u one year.    2. CPAP use counseling CPAP Counseling: had a lengthy discussion with the patient regarding the importance of PAP therapy in management of the sleep apnea. Patient appears to understand the risk factor reduction and also understands the risks associated with untreated sleep apnea. Patient will try to make a good faith effort to remain compliant with therapy. Also instructed the patient on proper cleaning of the device including the water must be changed daily if possible and use of distilled water is preferred. Patient understands that the machine should be regularly cleaned with appropriate recommended cleaning solutions that do not damage the PAP machine for example given white vinegar and water rinses. Other methods such as ozone treatment may not be as good as these simple methods to achieve cleaning.   3. Essential (primary) hypertension Hypertension Counseling:   The following hypertensive lifestyle modification were recommended and discussed:  1. Limiting alcohol intake to less than 1 oz/day of ethanol:(24 oz of beer or 8 oz of wine or 2 oz of 100-proof whiskey). 2. Take baby ASA 81 mg daily. 3. Importance of regular aerobic exercise and losing weight. 4. Reduce dietary saturated fat and cholesterol intake for overall cardiovascular health. 5. Maintaining adequate dietary potassium, calcium, and magnesium intake. 6. Regular  monitoring of the blood pressure. 7. Reduce sodium intake to less than 100 mmol/day (less than 2.3 gm of sodium or less than 6 gm of sodium choride)     General Counseling: I have discussed the findings of the evaluation and examination with Elease Hashimoto.  I have also discussed any further diagnostic evaluation thatmay be needed or ordered today. Rumaysa verbalizes understanding of the findings of todays visit. We also reviewed her medications today and discussed drug interactions and side effects including but not limited excessive drowsiness and altered mental states. We also discussed that there is always a risk not just to her but also people around her. she has been encouraged to call the office with any questions or concerns that should arise related to todays visit.  No orders of the defined types were placed in this encounter.       I have personally obtained a history, examined the patient, evaluated laboratory and imaging results, formulated the assessment and plan and placed orders. This patient was seen today by Emmaline Kluver, PA-C in collaboration with Dr. Freda Munro.   Yevonne Pax, MD [facility] Diplomate ABMS Pulmonary Critical Care Medicine and Sleep Medicine

## 2023-04-10 NOTE — Patient Instructions (Signed)

## 2024-02-20 ENCOUNTER — Other Ambulatory Visit: Payer: Self-pay | Admitting: Acute Care

## 2024-02-20 DIAGNOSIS — Z122 Encounter for screening for malignant neoplasm of respiratory organs: Secondary | ICD-10-CM

## 2024-02-20 DIAGNOSIS — Z87891 Personal history of nicotine dependence: Secondary | ICD-10-CM

## 2024-02-20 DIAGNOSIS — F17210 Nicotine dependence, cigarettes, uncomplicated: Secondary | ICD-10-CM

## 2024-03-14 ENCOUNTER — Ambulatory Visit
Admission: RE | Admit: 2024-03-14 | Discharge: 2024-03-14 | Disposition: A | Discharge status: Home / Self Care | Source: Ambulatory Visit | Attending: Acute Care | Admitting: Acute Care

## 2024-03-14 DIAGNOSIS — F17210 Nicotine dependence, cigarettes, uncomplicated: Secondary | ICD-10-CM

## 2024-03-14 DIAGNOSIS — Z122 Encounter for screening for malignant neoplasm of respiratory organs: Secondary | ICD-10-CM

## 2024-03-14 DIAGNOSIS — Z87891 Personal history of nicotine dependence: Secondary | ICD-10-CM

## 2024-03-25 ENCOUNTER — Other Ambulatory Visit: Payer: Self-pay

## 2024-03-25 DIAGNOSIS — Z87891 Personal history of nicotine dependence: Secondary | ICD-10-CM

## 2024-03-25 DIAGNOSIS — F17210 Nicotine dependence, cigarettes, uncomplicated: Secondary | ICD-10-CM

## 2024-03-25 DIAGNOSIS — Z122 Encounter for screening for malignant neoplasm of respiratory organs: Secondary | ICD-10-CM

## 2024-07-23 ENCOUNTER — Encounter: Payer: Self-pay | Admitting: Internal Medicine

## 2024-07-30 ENCOUNTER — Encounter: Payer: Self-pay | Admitting: Internal Medicine

## 2024-07-30 ENCOUNTER — Encounter
Admission: RE | Disposition: A | Discharge status: Home / Self Care | Payer: Self-pay | Source: Home / Self Care | Attending: Internal Medicine

## 2024-07-30 ENCOUNTER — Ambulatory Visit
Admission: RE | Admit: 2024-07-30 | Discharge: 2024-07-30 | Disposition: A | Discharge status: Home / Self Care | Attending: Internal Medicine | Admitting: Internal Medicine

## 2024-07-30 ENCOUNTER — Ambulatory Visit: Payer: Self-pay

## 2024-07-30 DIAGNOSIS — Z79899 Other long term (current) drug therapy: Secondary | ICD-10-CM | POA: Insufficient documentation

## 2024-07-30 DIAGNOSIS — E669 Obesity, unspecified: Secondary | ICD-10-CM | POA: Insufficient documentation

## 2024-07-30 DIAGNOSIS — I2510 Atherosclerotic heart disease of native coronary artery without angina pectoris: Secondary | ICD-10-CM | POA: Diagnosis not present

## 2024-07-30 DIAGNOSIS — F419 Anxiety disorder, unspecified: Secondary | ICD-10-CM | POA: Insufficient documentation

## 2024-07-30 DIAGNOSIS — Z1211 Encounter for screening for malignant neoplasm of colon: Secondary | ICD-10-CM | POA: Insufficient documentation

## 2024-07-30 DIAGNOSIS — Z87891 Personal history of nicotine dependence: Secondary | ICD-10-CM | POA: Insufficient documentation

## 2024-07-30 DIAGNOSIS — J449 Chronic obstructive pulmonary disease, unspecified: Secondary | ICD-10-CM | POA: Diagnosis not present

## 2024-07-30 DIAGNOSIS — E1142 Type 2 diabetes mellitus with diabetic polyneuropathy: Secondary | ICD-10-CM | POA: Diagnosis not present

## 2024-07-30 DIAGNOSIS — D123 Benign neoplasm of transverse colon: Secondary | ICD-10-CM | POA: Insufficient documentation

## 2024-07-30 DIAGNOSIS — F319 Bipolar disorder, unspecified: Secondary | ICD-10-CM | POA: Diagnosis not present

## 2024-07-30 DIAGNOSIS — E785 Hyperlipidemia, unspecified: Secondary | ICD-10-CM | POA: Insufficient documentation

## 2024-07-30 DIAGNOSIS — G4730 Sleep apnea, unspecified: Secondary | ICD-10-CM | POA: Insufficient documentation

## 2024-07-30 DIAGNOSIS — I10 Essential (primary) hypertension: Secondary | ICD-10-CM | POA: Insufficient documentation

## 2024-07-30 DIAGNOSIS — Z6831 Body mass index (BMI) 31.0-31.9, adult: Secondary | ICD-10-CM | POA: Diagnosis not present

## 2024-07-30 DIAGNOSIS — K219 Gastro-esophageal reflux disease without esophagitis: Secondary | ICD-10-CM | POA: Insufficient documentation

## 2024-07-30 DIAGNOSIS — E1151 Type 2 diabetes mellitus with diabetic peripheral angiopathy without gangrene: Secondary | ICD-10-CM | POA: Insufficient documentation

## 2024-07-30 DIAGNOSIS — Z7984 Long term (current) use of oral hypoglycemic drugs: Secondary | ICD-10-CM | POA: Diagnosis not present

## 2024-07-30 DIAGNOSIS — Z83719 Family history of colon polyps, unspecified: Secondary | ICD-10-CM | POA: Insufficient documentation

## 2024-07-30 DIAGNOSIS — D122 Benign neoplasm of ascending colon: Secondary | ICD-10-CM | POA: Insufficient documentation

## 2024-07-30 DIAGNOSIS — K5730 Diverticulosis of large intestine without perforation or abscess without bleeding: Secondary | ICD-10-CM | POA: Insufficient documentation

## 2024-07-30 HISTORY — DX: Nonrheumatic tricuspid valve disorder, unspecified: I36.9

## 2024-07-30 HISTORY — DX: Selective deficiency of immunoglobulin m (igm): D80.4

## 2024-07-30 HISTORY — DX: Chronic coronary microvascular dysfunction: I25.85

## 2024-07-30 HISTORY — DX: Type 2 diabetes mellitus with diabetic polyneuropathy: E11.42

## 2024-07-30 HISTORY — DX: Atherosclerotic heart disease of native coronary artery without angina pectoris: I25.10

## 2024-07-30 HISTORY — DX: Stricture of artery: I77.1

## 2024-07-30 HISTORY — PX: POLYPECTOMY: SHX149

## 2024-07-30 HISTORY — DX: Gastro-esophageal reflux disease without esophagitis: K21.9

## 2024-07-30 HISTORY — DX: Disorder of arteries and arterioles, unspecified: I77.9

## 2024-07-30 HISTORY — PX: COLONOSCOPY: SHX5424

## 2024-07-30 LAB — GLUCOSE, CAPILLARY: Glucose-Capillary: 91 mg/dL (ref 70–99)

## 2024-07-30 SURGERY — COLONOSCOPY
Anesthesia: General

## 2024-07-30 MED ORDER — LIDOCAINE HCL (CARDIAC) PF 100 MG/5ML IV SOSY
PREFILLED_SYRINGE | INTRAVENOUS | Status: DC | PRN
  Administered 2024-07-30: 80 mg via INTRAVENOUS

## 2024-07-30 MED ORDER — PROPOFOL 10 MG/ML IV BOLUS
INTRAVENOUS | Status: DC | PRN
  Administered 2024-07-30: 50 mg via INTRAVENOUS
  Administered 2024-07-30 (×2): 30 mg via INTRAVENOUS
  Administered 2024-07-30: 20 mg via INTRAVENOUS
  Administered 2024-07-30: 30 mg via INTRAVENOUS

## 2024-07-30 MED ORDER — DEXAMETHASONE SODIUM PHOSPHATE 4 MG/ML IJ SOLN
INTRAMUSCULAR | Status: DC | PRN
  Administered 2024-07-30: 10 mg via INTRAVENOUS

## 2024-07-30 MED ORDER — ONDANSETRON HCL 4 MG/2ML IJ SOLN
INTRAMUSCULAR | Status: AC
  Filled 2024-07-30: qty 2

## 2024-07-30 MED ORDER — ONDANSETRON HCL 4 MG/2ML IJ SOLN
INTRAMUSCULAR | Status: DC | PRN
  Administered 2024-07-30: 4 mg via INTRAVENOUS

## 2024-07-30 MED ORDER — LIDOCAINE HCL (PF) 2 % IJ SOLN
INTRAMUSCULAR | Status: AC
  Filled 2024-07-30: qty 5

## 2024-07-30 MED ORDER — SODIUM CHLORIDE 0.9 % IV SOLN
INTRAVENOUS | Status: DC
  Administered 2024-07-30: 500 mL via INTRAVENOUS

## 2024-07-30 NOTE — Interval H&P Note (Signed)
 History and Physical Interval Note:  07/30/2024 9:21 AM  Rebecca Frost  has presented today for surgery, with the diagnosis of Family history of polyps in the colon [Z83.719].  The various methods of treatment have been discussed with the patient and family. After consideration of risks, benefits and other options for treatment, the patient has consented to  Procedure(s) with comments: COLONOSCOPY (N/A) - DM as a surgical intervention.  The patient's history has been reviewed, patient examined, no change in status, stable for surgery.  I have reviewed the patient's chart and labs.  Questions were answered to the patient's satisfaction.     [place], Marco Raper

## 2024-07-30 NOTE — Anesthesia Preprocedure Evaluation (Addendum)
 Anesthesia Evaluation  Patient identified by MRN, date of birth, ID band Patient awake    Reviewed: Allergy & Precautions, NPO status , Patient's Chart, lab work & pertinent test results  History of Anesthesia Complications Negative for: history of anesthetic complications  Airway Mallampati: III  TM Distance: >3 FB Neck ROM: full    Dental  (+) Edentulous Upper   Pulmonary sleep apnea , COPD, former smoker   Pulmonary exam normal        Cardiovascular hypertension, + CAD and + Peripheral Vascular Disease  Normal cardiovascular exam     Neuro/Psych  PSYCHIATRIC DISORDERS Anxiety Depression Bipolar Disorder    Neuromuscular disease    GI/Hepatic Neg liver ROS,GERD  ,,  Endo/Other  diabetes    Renal/GU negative Renal ROS  negative genitourinary   Musculoskeletal   Abdominal   Peds  Hematology negative hematology ROS (+)   Anesthesia Other Findings Past Medical History: No date: Allergy No date: Anxiety No date: Arterial insufficiency No date: Bipolar 1 disorder (HCC) No date: Cardiomegaly No date: Chronic coronary microvascular dysfunction No date: COPD (chronic obstructive pulmonary disease) (HCC) No date: Coronary artery disease No date: Depression No date: Diabetes mellitus without complication (HCC) No date: Fibrocystic breast No date: GERD (gastroesophageal reflux disease) No date: Goiter, nontoxic, multinodular No date: Hyperlipidemia No date: Hypertension No date: IgM deficiency (HCC) No date: Obesity No date: PAOD (peripheral arterial occlusive disease) No date: Polyneuropathy due to type 2 diabetes mellitus (HCC) No date: Selective immunoglobulin M deficiency (HCC) No date: Sleep apnea No date: Thyroid nodule No date: Tobacco abuse No date: Tricuspid valve disorders, non-rheumatic No date: Tumor     Comment:  on the back of neck, and right ankle per pt No date: Vitamin D deficiency  Past  Surgical History: No date: BACK SURGERY No date: CHOLECYSTECTOMY No date: TONSILLECTOMY     Comment:  age 69 No date: TOTAL ABDOMINAL HYSTERECTOMY     Reproductive/Obstetrics negative OB ROS                              Anesthesia Physical Anesthesia Plan  ASA: 3  Anesthesia Plan: General   Post-op Pain Management: Minimal or no pain anticipated   Induction: Intravenous  PONV Risk Score and Plan: 2 and Propofol infusion and TIVA  Airway Management Planned: Natural Airway and Nasal Cannula  Additional Equipment:   Intra-op Plan:   Post-operative Plan:   Informed Consent: I have reviewed the patients History and Physical, chart, labs and discussed the procedure including the risks, benefits and alternatives for the proposed anesthesia with the patient or authorized representative who has indicated his/her understanding and acceptance.     Dental Advisory Given  Plan Discussed with: Anesthesiologist, CRNA and Surgeon  Anesthesia Plan Comments: (Patient consented for risks of anesthesia including but not limited to:  - adverse reactions to medications - risk of airway placement if required - damage to eyes, teeth, lips or other oral mucosa - nerve damage due to positioning  - sore throat or hoarseness - Damage to heart, brain, nerves, lungs, other parts of body or loss of life  Patient voiced understanding and assent.)         Anesthesia Quick Evaluation

## 2024-07-30 NOTE — Op Note (Signed)
 [facility] Gastroenterology Patient Name: Rebecca Frost Procedure Date: 07/30/2024 9:14 AM MRN: [id number] Account #: [id number] Date of Birth: [birth year] Admit Type: Outpatient Age: 68 Room: [facility] ENDO ROOM 1 Gender: Female Note Status: Finalized Instrument Name: Colon Scope [phone] Procedure:             Colonoscopy Indications:           Colon cancer screening in patient at increased risk:                         Family history of 1st-degree relative with colon polyps Providers:             Amery Minasyan K. Derry Arbogast MD, MD Medicines:             Propofol per Anesthesia Complications:         No immediate complications. Estimated blood loss:                         Minimal. Procedure:             Pre-Anesthesia Assessment:                        - The risks and benefits of the procedure and the                         sedation options and risks were discussed with the                         patient. All questions were answered and informed                         consent was obtained.                        - Patient identification and proposed procedure were                         verified prior to the procedure by the nurse. The                         procedure was verified in the procedure room.                        - ASA Grade Assessment: III - A patient with severe                         systemic disease.                        - After reviewing the risks and benefits, the patient                         was deemed in satisfactory condition to undergo the                         procedure.                        After obtaining informed consent, the colonoscope was  passed under direct vision. Throughout the procedure,                         the patient's blood pressure, pulse, and oxygen                         saturations were monitored continuously. The                         Colonoscope was introduced through the anus and                          advanced to the the cecum, identified by appendiceal                         orifice and ileocecal valve. The colonoscopy was                         performed without difficulty. The patient tolerated                         the procedure well. The quality of the bowel                         preparation was adequate. The ileocecal valve,                         appendiceal orifice, and rectum were photographed. Findings:      The perianal and digital rectal examinations were normal. Pertinent       negatives include normal sphincter tone and no palpable rectal lesions.      Two sessile polyps were found in the transverse colon and ascending       colon. The polyps were 6 to 8 mm in size. These polyps were removed with       a cold snare. Resection and retrieval were complete. Estimated blood       loss was minimal.      Many large-mouthed and small-mouthed diverticula were found in the       sigmoid colon. There was no evidence of diverticular bleeding.      The exam was otherwise without abnormality on direct and retroflexion       views. Impression:            - Two 6 to 8 mm polyps in the transverse colon and in                         the ascending colon, removed with a cold snare.                         Resected and retrieved.                        - Mild diverticulosis in the sigmoid colon. There was                         no evidence of diverticular bleeding.                        - The examination was  otherwise normal on direct and                         retroflexion views. Recommendation:        - Patient has a contact number available for                         emergencies. The signs and symptoms of potential                         delayed complications were discussed with the patient.                         Return to normal activities tomorrow. Written                         discharge instructions were provided to the patient.                         - Resume previous diet.                        - Continue present medications.                        - Repeat colonoscopy is recommended for surveillance.                         The colonoscopy date will be determined after                         pathology results from today's exam become available                         for review.                        - Return to GI office PRN.                        - The findings and recommendations were discussed with                         the patient. Procedure Code(s):     --- Professional ---                        [phone], Colonoscopy, flexible; with removal of                         tumor(s), polyp(s), or other lesion(s) by snare                         technique Diagnosis Code(s):     --- Professional ---                        K57.30, Diverticulosis of large intestine without                         perforation or abscess without bleeding  D12.2, Benign neoplasm of ascending colon                        D12.3, Benign neoplasm of transverse colon (hepatic                         flexure or splenic flexure)                        Z83.71, Family history of colonic polyps CPT copyright 2022 American Medical Association. All rights reserved. The codes documented in this report are preliminary and upon coder review may  be revised to meet current compliance requirements. Ladell MARLA Boss MD, MD 07/30/2024 9:42:25 AM This report has been signed electronically. Number of Addenda: 0 Note Initiated On: 07/30/2024 9:14 AM Scope Withdrawal Time: 0 hours 4 minutes 39 seconds  Total Procedure Duration: 0 hours 12 minutes 0 seconds  Estimated Blood Loss:  Estimated blood loss was minimal.      [facility]

## 2024-07-30 NOTE — Anesthesia Postprocedure Evaluation (Signed)
 Anesthesia Post Note  Patient: Candiace West  Procedure(s) Performed: COLONOSCOPY POLYPECTOMY, INTESTINE  Patient location during evaluation: Endoscopy Anesthesia Type: General Level of consciousness: awake and alert Pain management: pain level controlled Vital Signs Assessment: post-procedure vital signs reviewed and stable Respiratory status: spontaneous breathing, nonlabored ventilation, respiratory function stable and patient connected to nasal cannula oxygen Cardiovascular status: blood pressure returned to baseline and stable Postop Assessment: no apparent nausea or vomiting Anesthetic complications: no   No notable events documented.   Last Vitals:  Vitals:   07/30/24 0954 07/30/24 1004  BP: 120/66 135/82  Pulse: (!) 58 (!) 57  Resp: 15 13  Temp:    SpO2: 97% 99%    Last Pain:  Vitals:   07/30/24 1004  TempSrc:   PainSc: 0-No pain                 Lendia LITTIE Mae

## 2024-07-30 NOTE — Transfer of Care (Signed)
 Immediate Anesthesia Transfer of Care Note  Patient: Rebecca Frost  Procedure(s) Performed: COLONOSCOPY POLYPECTOMY, INTESTINE  Patient Location: PACU and Endoscopy Unit  Anesthesia Type:MAC  Level of Consciousness: awake and drowsy  Airway & Oxygen Therapy: Patient Spontanous Breathing and Patient connected to nasal cannula oxygen  Post-op Assessment: Report given to RN and Post -op Vital signs reviewed and stable  Post vital signs: Reviewed and stable  Last Vitals:  Vitals Value Taken Time  BP 115/67 07/30/24 09:44  Temp 35.8 C 07/30/24 09:44  Pulse 64 07/30/24 09:45  Resp 19 07/30/24 09:45  SpO2 96 % 07/30/24 09:45  Vitals shown include unfiled device data.  Last Pain:  Vitals:   07/30/24 0848  TempSrc: Temporal  PainSc: 0-No pain         Complications: No notable events documented.

## 2024-07-30 NOTE — H&P (Signed)
 Outpatient short stay form Pre-procedure 07/30/2024 9:21 AM Rebecca Frost, M.D.  Primary Physician: [name], NP  Reason for visit:  Family history of colon polyps  History of present illness:   68 year old patient presenting for family history of colon polyps. Patient denies any change in bowel habits, rectal bleeding or involuntary weight loss.     Current Facility-Administered Medications:    0.9 %  sodium chloride infusion, , Intravenous, Continuous, Tindall, Ladell POUR, MD, Last Rate: 20 mL/hr at 07/30/24 0900, 500 mL at 07/30/24 0900  Medications Prior to Admission  Medication Sig Dispense Refill Last Dose/Taking   ammonium lactate (LAC-HYDRIN) 12 % lotion Apply topically.   Past Week   Cholecalciferol (VITAMIN D3) 100000 UNIT/GM POWD Take by mouth.   Past Week   CRANBERRY PO Take by mouth.   Past Week   Cranberry POWD Take by mouth.   Past Week   DULoxetine (CYMBALTA) 60 MG capsule duloxetine 60 mg capsule,delayed release   Past Week   empagliflozin (JARDIANCE) 25 MG TABS tablet Jardiance 25 mg tablet   Past Week   esomeprazole (NEXIUM) 40 MG capsule Take 40 mg by mouth every morning.   Past Week   esomeprazole (NEXIUM) 40 MG capsule Take 40 mg by mouth daily at 12 noon.   Past Week   fluticasone (FLONASE) 50 MCG/ACT nasal spray Place 2 sprays into both nostrils daily.   Past Week   gabapentin (NEURONTIN) 300 MG capsule Take 300 mg by mouth 3 (three) times daily. Patient reports she is taking 2-4 pills a day   Past Week   LATUDA 80 MG TABS tablet Take 80 mg by mouth at bedtime.   Past Week   lidocaine (XYLOCAINE) 5 % ointment Apply topically.   Past Week   lisinopril (ZESTRIL) 10 MG tablet lisinopril 10 mg tablet   Past Week   NEXLIZET 180-10 MG TABS Take 1 tablet by mouth daily.   Past Week   Omega-3 Fatty Acids (FISH OIL) 1000 MG CAPS omega-3 acid ethyl esters 1 gram capsule   Past Week   VITAMIN D PO Take by mouth.   Past Week   QUEtiapine (SEROQUEL) 100 MG tablet  Take 100 mg by mouth at bedtime.      RYBELSUS 14 MG TABS Take 1 tablet by mouth every morning.   07/27/2024     Allergies  Allergen Reactions   Nsaids     Other reaction(s): Flushing   Acetaminophen-Codeine     Other reaction(s): Unknown   Aspirin    Azithromycin    Celebrex [Celecoxib]    Codeine    Iodine    Levofloxacin Swelling   Lortab [Hydrocodone-Acetaminophen]    Niacin    Statins     Other reaction(s): Unknown   Sulfa Antibiotics     Patient states all antibiotics Patient states all antibiotics     Past Medical History:  Diagnosis Date   Allergy    Anxiety    Arterial insufficiency    Bipolar 1 disorder (HCC)    Cardiomegaly    Chronic coronary microvascular dysfunction    COPD (chronic obstructive pulmonary disease) (HCC)    Coronary artery disease    Depression    Diabetes mellitus without complication (HCC)    Fibrocystic breast    GERD (gastroesophageal reflux disease)    Goiter, nontoxic, multinodular    Hyperlipidemia    Hypertension    IgM deficiency (HCC)    Obesity    PAOD (peripheral  arterial occlusive disease)    Polyneuropathy due to type 2 diabetes mellitus (HCC)    Selective immunoglobulin M deficiency (HCC)    Sleep apnea    Thyroid nodule    Tobacco abuse    Tricuspid valve disorders, non-rheumatic    Tumor    on the back of neck, and right ankle per pt   Vitamin D deficiency     Review of systems:  Otherwise negative.    Physical Exam  Gen: Alert, oriented. Appears stated age.  HEENT: [place]/AT. PERRLA. Lungs: CTA, no wheezes. CV: RR nl S1, S2. Abd: soft, benign, no masses. BS+ Ext: No edema. Pulses 2+    Planned procedures: Proceed with colonoscopy. The patient understands the nature of the planned procedure, indications, risks, alternatives and potential complications including but not limited to bleeding, infection, perforation, damage to internal organs and possible oversedation/side effects from anesthesia. The patient  agrees and gives consent to proceed.  Please refer to procedure notes for findings, recommendations and patient disposition/instructions.     [name], M.D. Gastroenterology 07/30/2024  9:21 AM

## 2024-07-31 LAB — SURGICAL PATHOLOGY
